# Patient Record
Sex: Female | Born: 1995 | Race: Black or African American | Hispanic: No | Marital: Single | Smoking: Never smoker
Health system: Southern US, Community
[De-identification: ages and names within clinical notes are randomized; demographics above are authoritative.]

## PROBLEM LIST (undated history)

## (undated) DIAGNOSIS — K5010 Crohn's disease of large intestine without complications: Secondary | ICD-10-CM

## (undated) DIAGNOSIS — K589 Irritable bowel syndrome without diarrhea: Secondary | ICD-10-CM

## (undated) HISTORY — DX: Irritable bowel syndrome, unspecified: K58.9

## (undated) HISTORY — DX: Crohn's disease of large intestine without complications: K50.10

## (undated) HISTORY — PX: COLONOSCOPY: SHX174

---
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
## 2015-08-09 ENCOUNTER — Other Ambulatory Visit: Payer: Self-pay | Admitting: Physician Assistant

## 2015-08-09 DIAGNOSIS — R1011 Right upper quadrant pain: Secondary | ICD-10-CM

## 2015-08-16 ENCOUNTER — Ambulatory Visit
Admission: RE | Admit: 2015-08-16 | Discharge: 2015-08-16 | Disposition: A | Discharge status: Home / Self Care | Payer: BLUE CROSS/BLUE SHIELD | Source: Ambulatory Visit | Attending: Physician Assistant | Admitting: Physician Assistant

## 2015-08-16 DIAGNOSIS — R1011 Right upper quadrant pain: Secondary | ICD-10-CM

## 2015-10-16 ENCOUNTER — Ambulatory Visit (HOSPITAL_COMMUNITY)
Admission: EM | Admit: 2015-10-16 | Discharge: 2015-10-16 | Disposition: A | Discharge status: Home / Self Care | Payer: BLUE CROSS/BLUE SHIELD | Attending: Family Medicine | Admitting: Family Medicine

## 2015-10-16 ENCOUNTER — Encounter (HOSPITAL_COMMUNITY): Payer: Self-pay | Admitting: Emergency Medicine

## 2015-10-16 DIAGNOSIS — R1084 Generalized abdominal pain: Secondary | ICD-10-CM | POA: Insufficient documentation

## 2015-10-16 DIAGNOSIS — K529 Noninfective gastroenteritis and colitis, unspecified: Secondary | ICD-10-CM | POA: Diagnosis not present

## 2015-10-16 DIAGNOSIS — R197 Diarrhea, unspecified: Secondary | ICD-10-CM | POA: Insufficient documentation

## 2015-10-16 DIAGNOSIS — R110 Nausea: Secondary | ICD-10-CM | POA: Diagnosis not present

## 2015-10-16 DIAGNOSIS — R109 Unspecified abdominal pain: Secondary | ICD-10-CM | POA: Diagnosis present

## 2015-10-16 LAB — CBC WITH DIFFERENTIAL/PLATELET
Basophils Absolute: 0 10*3/uL (ref 0.0–0.1)
Basophils Relative: 0 %
EOS PCT: 2 %
Eosinophils Absolute: 0.2 10*3/uL (ref 0.0–0.7)
HEMATOCRIT: 35.6 % — AB (ref 36.0–46.0)
Hemoglobin: 11.3 g/dL — ABNORMAL LOW (ref 12.0–15.0)
LYMPHS PCT: 19 %
Lymphs Abs: 1.8 10*3/uL (ref 0.7–4.0)
MCH: 26.6 pg (ref 26.0–34.0)
MCHC: 31.7 g/dL (ref 30.0–36.0)
MCV: 83.8 fL (ref 78.0–100.0)
Monocytes Absolute: 0.7 10*3/uL (ref 0.1–1.0)
Monocytes Relative: 7 %
NEUTROS PCT: 72 %
Neutro Abs: 6.5 10*3/uL (ref 1.7–7.7)
Platelets: 312 10*3/uL (ref 150–400)
RBC: 4.25 MIL/uL (ref 3.87–5.11)
RDW: 14.4 % (ref 11.5–15.5)
WBC: 9.1 10*3/uL (ref 4.0–10.5)

## 2015-10-16 LAB — COMPREHENSIVE METABOLIC PANEL
ALBUMIN: 3.5 g/dL (ref 3.5–5.0)
ALT: 10 U/L — ABNORMAL LOW (ref 14–54)
ANION GAP: 10 (ref 5–15)
AST: 14 U/L — ABNORMAL LOW (ref 15–41)
Alkaline Phosphatase: 67 U/L (ref 38–126)
BILIRUBIN TOTAL: 0.5 mg/dL (ref 0.3–1.2)
BUN: <5 mg/dL — ABNORMAL LOW (ref 6–20)
CALCIUM: 9.3 mg/dL (ref 8.9–10.3)
CO2: 24 mmol/L (ref 22–32)
Chloride: 103 mmol/L (ref 101–111)
Creatinine, Ser: 0.8 mg/dL (ref 0.44–1.00)
GFR calc non Af Amer: >60 mL/min (ref >60)
Glucose, Bld: 73 mg/dL (ref 65–99)
POTASSIUM: 3.7 mmol/L (ref 3.5–5.1)
Sodium: 137 mmol/L (ref 135–145)
Total Protein: 7.5 g/dL (ref 6.5–8.1)

## 2015-10-16 LAB — C-REACTIVE PROTEIN: CRP: 5.6 mg/dL — AB (ref <1.0)

## 2015-10-16 LAB — SEDIMENTATION RATE: Sed Rate: 46 mm/hr — ABNORMAL HIGH (ref 0–22)

## 2015-10-16 NOTE — ED Notes (Signed)
The patient presented to the [facility] with a complaint of abdominal pain, N/D off and on for 2 months with the last week being worse. The patient stated that she was diagnosed with IBS through [facility].

## 2015-10-16 NOTE — ED Notes (Signed)
Patient provided a sterile collection cup, gloves, and tongue blade as well as instructions on how to collect a stool sample. Instructed to bring back to [facility] when collected.

## 2015-10-16 NOTE — Discharge Instructions (Signed)
Clear liquid diet and probiotic--[employer]- until seen by specialist for recheck.

## 2015-10-16 NOTE — ED Provider Notes (Signed)
CSN: [id number]     Arrival date & time 10/16/15  1459 History   First MD Initiated Contact with Patient 10/16/15 1709     Chief Complaint  Patient presents with  . Abdominal Pain  . Diarrhea  . Nausea   (Consider location/radiation/quality/duration/timing/severity/associated sxs/prior Treatment) Patient is a 20 y.o. female presenting with abdominal pain. The history is provided by the patient and a parent.  Abdominal Pain Pain location:  Generalized Pain quality: sharp   Pain radiates to:  Does not radiate Pain severity:  Moderate Onset quality:  Gradual Duration:  8 weeks Progression:  Worsening Chronicity:  Chronic Context: not diet changes, not laxative use, not sick contacts and not suspicious food intake   Context comment:  Watery stool. Relieved by:  None tried Worsened by:  Nothing tried Ineffective treatments:  None tried Associated symptoms: diarrhea and nausea   Associated symptoms: no constipation and no vomiting   Risk factors comment:  U/s for gallbladder neg.. seen by Ok [facility] suggested IBS.   History reviewed. No pertinent past medical history. Past Surgical History  Procedure Laterality Date  . Wisdom tooth extraction  01/2013   History reviewed. No pertinent family history. Social History  Substance Use Topics  . Smoking status: Never Smoker   . Smokeless tobacco: None  . Alcohol Use: No   OB History    No data available     Review of Systems  Constitutional: Negative.   Gastrointestinal: Positive for nausea, abdominal pain, diarrhea and blood in stool. Negative for vomiting, constipation and abdominal distention.  All other systems reviewed and are negative.   Allergies  Review of patient's allergies indicates no known allergies.  Home Medications   Prior to Admission medications   Medication Sig Start Date End Date Taking? Authorizing Provider  norgestimate-ethinyl estradiol (ORTHO-CYCLEN,SPRINTEC,PREVIFEM) 0.25-35 MG-MCG tablet Take 1  tablet by mouth daily.   Yes Historical Provider, MD  HYDROcodone-acetaminophen (NORCO) 7.5-325 MG per tablet Take 1 tablet by mouth every 6 (six) hours as needed for pain.    Historical Provider, MD  ibuprofen (ADVIL,MOTRIN) 200 MG tablet Take 200 mg by mouth every 6 (six) hours as needed for pain.    Historical Provider, MD   Meds Ordered and Administered this Visit  Medications - No data to display  BP 124/90 mmHg  Pulse 86  Temp(Src) 98.9 F (37.2 C) (Oral)  Resp 18  SpO2 100%  LMP 10/09/2015 (Exact Date) No data found.   Physical Exam  Constitutional: She is oriented to person, place, and time. She appears well-developed and well-nourished. No distress.  HENT:  Mouth/Throat: Oropharynx is clear and moist.  Abdominal: Soft. Bowel sounds are normal. She exhibits no distension and no mass. There is generalized tenderness. There is no rebound and no guarding.  Neurological: She is alert and oriented to person, place, and time.  Skin: Skin is warm and dry.  Nursing note and vitals reviewed.   ED Course  Procedures (including critical care time)  Labs Review Labs Reviewed  C DIFFICILE QUICK SCREEN W PCR REFLEX  OVA + PARASITE EXAM  CBC WITH DIFFERENTIAL/PLATELET  COMPREHENSIVE METABOLIC PANEL  SEDIMENTATION RATE  C-REACTIVE PROTEIN    Imaging Review No results found.   Visual Acuity Review  Right Eye Distance:   Left Eye Distance:   Bilateral Distance:    Right Eye Near:   Left Eye Near:    Bilateral Near:         MDM   1. Diarrhea in  adult patient        Linna Hoff, MD 10/16/15 [phone]

## 2015-10-17 ENCOUNTER — Telehealth: Payer: Self-pay | Admitting: Gastroenterology

## 2015-10-17 NOTE — Telephone Encounter (Signed)
Scheduled patient with Doug Sou, PA on 10/20/15 at 2:00 PM.(armbruster is hospital MD this week). Need to call patient with appointment

## 2015-10-17 NOTE — Telephone Encounter (Signed)
The note also says she has seen [facility] for [facility].

## 2015-10-17 NOTE — Telephone Encounter (Signed)
She saw [facility] PCP

## 2015-10-20 ENCOUNTER — Ambulatory Visit (INDEPENDENT_AMBULATORY_CARE_PROVIDER_SITE_OTHER): Payer: BLUE CROSS/BLUE SHIELD | Admitting: Gastroenterology

## 2015-10-20 ENCOUNTER — Encounter: Payer: Self-pay | Admitting: Gastroenterology

## 2015-10-20 VITALS — BP 126/60 | HR 82 | Ht 66.0 in | Wt 190.0 lb

## 2015-10-20 DIAGNOSIS — R112 Nausea with vomiting, unspecified: Secondary | ICD-10-CM | POA: Diagnosis not present

## 2015-10-20 DIAGNOSIS — R1084 Generalized abdominal pain: Secondary | ICD-10-CM | POA: Insufficient documentation

## 2015-10-20 DIAGNOSIS — K625 Hemorrhage of anus and rectum: Secondary | ICD-10-CM | POA: Insufficient documentation

## 2015-10-20 DIAGNOSIS — R197 Diarrhea, unspecified: Secondary | ICD-10-CM | POA: Diagnosis not present

## 2015-10-20 DIAGNOSIS — R1110 Vomiting, unspecified: Secondary | ICD-10-CM | POA: Insufficient documentation

## 2015-10-20 MED ORDER — ONDANSETRON HCL 4 MG PO TABS: 4.0000 mg | ORAL_TABLET | Freq: Three times a day (TID) | ORAL | Status: DC | PRN

## 2015-10-20 MED ORDER — DICYCLOMINE HCL 10 MG PO CAPS: 10.0000 mg | ORAL_CAPSULE | Freq: Three times a day (TID) | ORAL | Status: DC

## 2015-10-20 MED ORDER — NA SULFATE-K SULFATE-MG SULF 17.5-3.13-1.6 GM/177ML PO SOLN: 1.0000 | Freq: Once | ORAL | Status: DC

## 2015-10-20 NOTE — Progress Notes (Signed)
Thank you for sending this case to me. I have reviewed the entire note, and the outlined plan seems appropriate.  

## 2015-10-20 NOTE — Patient Instructions (Signed)
  We have sent the following medications to your pharmacy for you to pick up at your convenience:  Zofran, Dicyclomine    You have been scheduled for a colonoscopy. Please follow written instructions given to you at your visit today.  Please pick up your prep supplies at the pharmacy within the next 1-3 days. If you use inhalers (even only as needed), please bring them with you on the day of your procedure. Your physician has requested that you go to www.startemmi.com and enter the access code given to you at your visit today. This web site gives a general overview about your procedure. However, you should still follow specific instructions given to you by our office regarding your preparation for the procedure.

## 2015-10-20 NOTE — Progress Notes (Signed)
10/20/2015 LAMOINE FREDRICKSEN [id number] 03-04-1996   HISTORY OF PRESENT ILLNESS:  This is a pleasant 20 year old female who is new to our office, was referred here by urgent care, Dr. Juventino Slovak, for evaluation of several GI symptoms.  She is here today with her mother. She complains of crampy lower abdominal pain worse after eating. She has diarrhea multiple times a day, recently 10-12 times a day, with some blood. Complains of nausea and vomiting, unable to keep much down and she really hasn't had much appetite. She tells me that some of her symptoms began about 3 months or so ago, but have significantly worsened recently.  The diarrhea and particular has particularly worsened as well as the abdominal pain.  She had a normal abdominal ultrasound back in April. Otherwise she's only had labs performed. She had a normal CBC and CMP. Sedimentation rate was elevated at 46 and CRP was elevated at 5.6, however. She denies any family history of Crohn's disease and ulcerative colitis. When she initially presented with GI complaints she was told by her PCP that she may have some irritable bowel syndrome.   Past Medical History  Diagnosis Date  . IBS (irritable bowel syndrome)    Past Surgical History  Procedure Laterality Date  . Wisdom tooth extraction  01/2013    reports that she has never smoked. She does not have any smokeless tobacco history on file. She reports that she uses illicit drugs (Marijuana). She reports that she does not drink alcohol. family history includes Leukemia in her brother; Uterine cancer in her paternal aunt. There is no history of Colon cancer. No Known Allergies    Outpatient Encounter Prescriptions as of 10/20/2015  Medication Sig  . acetaminophen (TYLENOL) 500 MG tablet Take 1,000 mg by mouth 2 (two) times daily as needed.  . norgestimate-ethinyl estradiol (ORTHO-CYCLEN,SPRINTEC,PREVIFEM) 0.25-35 MG-MCG tablet Take 1 tablet by mouth daily.  Marland Kitchen dicyclomine (BENTYL) 10 MG  capsule Take 1 capsule (10 mg total) by mouth 3 (three) times daily.  . Na Sulfate-K Sulfate-Mg Sulf 17.5-3.13-1.6 GM/180ML SOLN Take 1 kit by mouth once.  . ondansetron (ZOFRAN) 4 MG tablet Take 1 tablet (4 mg total) by mouth every 8 (eight) hours as needed for nausea or vomiting.  . [DISCONTINUED] HYDROcodone-acetaminophen (NORCO) 7.5-325 MG per tablet Take 1 tablet by mouth every 6 (six) hours as needed for pain.  . [DISCONTINUED] ibuprofen (ADVIL,MOTRIN) 200 MG tablet Take 200 mg by mouth every 6 (six) hours as needed for pain.   No facility-administered encounter medications on file as of 10/20/2015.     REVIEW OF SYSTEMS  : All other systems reviewed and negative except where noted in the History of Present Illness.   PHYSICAL EXAM: BP 126/60 mmHg  Pulse 82  Ht '5\' 6"'  (1.676 m)  Wt 190 lb (86.183 kg)  BMI 30.68 kg/m2  LMP 10/09/2015 (Exact Date) General: Well developed black female in no acute distress Head: Normocephalic and atraumatic Eyes:  Sclerae anicteric, conjunctiva pink. Ears: Normal auditory acuity Lungs: Clear throughout to auscultation Heart: Regular rate and rhythm Abdomen: Soft, non-distended.  Normal bowel sounds.  Diffuse TTP. Musculoskeletal: Symmetrical with no gross deformities  Skin: No lesions on visible extremities Extremities: No edema  Neurological: Alert oriented x 4, grossly non-focal Psychological:  Alert and cooperative. Normal mood and affect  ASSESSMENT AND PLAN: -20 year old female with multiple GI complaints including abdominal pain, diarrhea, rectal bleeding, nausea and vomiting, and decreased appetite. Some symptoms present to a degree  for a few months, but significantly worsened over the past couple of weeks. She does have elevated inflammatory markers, CRP and sedimentation rate.  We'll plan for colonoscopy for evaluation to rule out IBD.  The risks, benefits, and alternatives to colonoscopy were discussed with the patient and she consents to  proceed.  While we are awaiting colonoscopy I would like her to perform stool studies, GI pathogen panel and O&P to rule out C. difficile, etc. I'm going to treat her symptomatically with Zofran and dicyclomine for now.  Advised to remain on a clear liquid diet or soft/bland/low fat/low fiber diet as tolerated.  CC:  No ref. provider found

## 2015-10-23 ENCOUNTER — Other Ambulatory Visit: Payer: BLUE CROSS/BLUE SHIELD

## 2015-10-23 DIAGNOSIS — R197 Diarrhea, unspecified: Secondary | ICD-10-CM | POA: Diagnosis not present

## 2015-10-23 DIAGNOSIS — K625 Hemorrhage of anus and rectum: Secondary | ICD-10-CM | POA: Diagnosis not present

## 2015-10-23 DIAGNOSIS — R112 Nausea with vomiting, unspecified: Secondary | ICD-10-CM | POA: Diagnosis not present

## 2015-10-23 DIAGNOSIS — R1084 Generalized abdominal pain: Secondary | ICD-10-CM | POA: Diagnosis not present

## 2015-10-24 ENCOUNTER — Encounter: Payer: Self-pay | Admitting: Gastroenterology

## 2015-10-24 LAB — OVA AND PARASITE EXAMINATION: OP: NONE SEEN

## 2015-10-24 LAB — GASTROINTESTINAL PATHOGEN PANEL PCR
C. difficile Tox A/B, PCR: NOT DETECTED
CRYPTOSPORIDIUM, PCR: NOT DETECTED
Campylobacter, PCR: NOT DETECTED
E COLI (ETEC) LT/ST, PCR: NOT DETECTED
E COLI (STEC) STX1/STX2, PCR: NOT DETECTED
E COLI 0157, PCR: NOT DETECTED
Giardia lamblia, PCR: NOT DETECTED
NOROVIRUS, PCR: NOT DETECTED
Rotavirus A, PCR: NOT DETECTED
Salmonella, PCR: NOT DETECTED
Shigella, PCR: NOT DETECTED

## 2015-11-03 ENCOUNTER — Encounter: Payer: Self-pay | Admitting: Gastroenterology

## 2015-11-03 ENCOUNTER — Ambulatory Visit (AMBULATORY_SURGERY_CENTER): Payer: BLUE CROSS/BLUE SHIELD | Admitting: Gastroenterology

## 2015-11-03 VITALS — BP 111/72 | HR 88 | Temp 97.1°F | Resp 11 | Ht 66.0 in | Wt 190.0 lb

## 2015-11-03 DIAGNOSIS — R197 Diarrhea, unspecified: Secondary | ICD-10-CM | POA: Diagnosis not present

## 2015-11-03 DIAGNOSIS — K529 Noninfective gastroenteritis and colitis, unspecified: Secondary | ICD-10-CM | POA: Diagnosis not present

## 2015-11-03 MED ORDER — SODIUM CHLORIDE 0.9 % IV SOLN: 500.0000 mL | INTRAVENOUS | Status: DC

## 2015-11-03 MED ORDER — PREDNISONE 10 MG PO TABS: ORAL_TABLET | ORAL | Status: DC

## 2015-11-03 NOTE — Progress Notes (Signed)
Called to room to assist during endoscopic procedure.  Patient ID and intended procedure confirmed with present staff. Received instructions for my participation in the procedure from the performing physician.  

## 2015-11-03 NOTE — Op Note (Signed)
[facility] Patient Name: Ellen Patrick Procedure Date: 11/03/2015 10:18 AM MRN: [id number] Endoscopist: Sherilyn Cooter L. Myrtie Neither , MD Age: 20 Referring MD:  Date of Birth: 1996-01-25 Gender: Female Account #: [id number] Procedure:                Colonoscopy Indications:              Generalized abdominal pain, Chronic diarrhea,                            Rectal bleeding Medicines:                Monitored Anesthesia Care Procedure:                Pre-Anesthesia Assessment:                           - Prior to the procedure, a History and Physical                            was performed, and patient medications and                            allergies were reviewed. The patient's tolerance of                            previous anesthesia was also reviewed. The risks                            and benefits of the procedure and the sedation                            options and risks were discussed with the patient.                            All questions were answered, and informed consent                            was obtained. Prior Anticoagulants: The patient has                            taken no previous anticoagulant or antiplatelet                            agents. ASA Grade Assessment: II - A patient with                            mild systemic disease. After reviewing the risks                            and benefits, the patient was deemed in                            satisfactory condition to undergo the procedure.  After obtaining informed consent, the colonoscope                            was passed under direct vision. Throughout the                            procedure, the patient's blood pressure, pulse, and                            oxygen saturations were monitored continuously. The                            Model CF-HQ190L [id number]) scope was introduced                            through the anus and advanced to the 15 cm into  the                            ileum. The colonoscopy was performed without                            difficulty. The patient tolerated the procedure                            well. The quality of the bowel preparation was                            excellent. The terminal ileum, ileocecal valve,                            appendiceal orifice, and rectum were photographed.                            The bowel preparation used was Miralax. Scope In: 10:27:23 AM Scope Out: 10:43:47 AM Scope Withdrawal Time: 0 hours 12 minutes 7 seconds  Total Procedure Duration: 0 hours 16 minutes 24 seconds  Findings:                 The perianal and digital rectal examinations were                            normal.                           The terminal ileum appeared normal.                           Inflammation characterized by congestion (edema)                            and deep ulcerations was found as patches                            surrounded by normal mucosa throughout the colon.  No sites were spared, though it was mild through                            the sigmoid colon and rectum. There was a shallow                            ulcer at the anal verge, but no anal/perianal                            disease. Overall, the colitis was moderate in                            severity, and the findings are new. Biopsies were                            taken from the cecum and proximal transverse colon                            with a cold forceps for histology.                           Internal hemorrhoids were found during                            retroflexion. The hemorrhoids were Grade I                            (internal hemorrhoids that do not prolapse). Complications:            No immediate complications. Estimated Blood Loss:     Estimated blood loss: none. Impression:               - Crohn's disease with colonic involvement.                             Inflammation was found in the colon. This was                            moderate in severity. The findings are new compared                            to previous examinations. Biopsied.                           - The examined portion of the ileum was normal.                           - Internal hemorrhoids. Recommendation:           - Patient has a contact number available for                            emergencies. The signs and symptoms of potential  delayed complications were discussed with the                            patient. Return to normal activities tomorrow.                            Written discharge instructions were provided to the                            patient.                           - Continue present medications except dicyclomine.                           - Await pathology results.                           - Use prednisone 40 mg PO once a day for 14 days,                            then decrease to 30 mg once daily until office                            follow-up. Disp#100, RF: none                           - Return to my office in 3 weeks. This follow up                            will be arranged when biopsy results received next                            week.                           - Resume previous diet.                           - No recommendation at this time regarding repeat                            colonoscopy due to pending clinical course. [name] L. Myrtie Neither, MD 11/03/2015 10:56:40 AM This report has been signed electronically.

## 2015-11-03 NOTE — Progress Notes (Signed)
Report to PACU, RN, vss, BBS= Clear.  

## 2015-11-03 NOTE — Progress Notes (Signed)
New rx was sent to [facility] at [place].  Maw  No problems noted in the recovery room. maw

## 2015-11-03 NOTE — Patient Instructions (Signed)
YOU HAD AN ENDOSCOPIC PROCEDURE TODAY AT THE [facility]:   Refer to the procedure report that was given to you for any specific questions about what was found during the examination.  If the procedure report does not answer your questions, please call your gastroenterologist to clarify.  If you requested that your care partner not be given the details of your procedure findings, then the procedure report has been included in a sealed envelope for you to review at your convenience later.  YOU SHOULD EXPECT: Some feelings of bloating in the abdomen. Passage of more gas than usual.  Walking can help get rid of the air that was put into your GI tract during the procedure and reduce the bloating. If you had a lower endoscopy (such as a colonoscopy or flexible sigmoidoscopy) you may notice spotting of blood in your stool or on the toilet paper. If you underwent a bowel prep for your procedure, you may not have a normal bowel movement for a few days.  Please Note:  You might notice some irritation and congestion in your nose or some drainage.  This is from the oxygen used during your procedure.  There is no need for concern and it should clear up in a day or so.  SYMPTOMS TO REPORT IMMEDIATELY:   Following lower endoscopy (colonoscopy or flexible sigmoidoscopy):  Excessive amounts of blood in the stool  Significant tenderness or worsening of abdominal pains  Swelling of the abdomen that is new, acute  Fever of 100F or higher   For urgent or emergent issues, a gastroenterologist can be reached at any hour by calling [phone].   DIET: Your first meal following the procedure should be a small meal and then it is ok to progress to your normal diet. Heavy or fried foods are harder to digest and may make you feel nauseous or bloated.  Likewise, meals heavy in dairy and vegetables can increase bloating.  Drink plenty of fluids but you should avoid alcoholic beverages for 24  hours.  ACTIVITY:  You should plan to take it easy for the rest of today and you should NOT DRIVE or use heavy machinery until tomorrow (because of the sedation medicines used during the test).    FOLLOW UP: Our staff will call the number listed on your records the next business day following your procedure to check on you and address any questions or concerns that you may have regarding the information given to you following your procedure. If we do not reach you, we will leave a message.  However, if you are feeling well and you are not experiencing any problems, there is no need to return our call.  We will assume that you have returned to your regular daily activities without incident.  If any biopsies were taken you will be contacted by phone or by letter within the next 1-3 weeks.  Please call us at [phone] if you have not heard about the biopsies in 3 weeks.    SIGNATURES/CONFIDENTIALITY: You and/or your care partner have signed paperwork which will be entered into your electronic medical record.  These signatures attest to the fact that that the information above on your After Visit Summary has been reviewed and is understood.  Full responsibility of the confidentiality of this discharge information lies with you and/or your care-partner.   Handouts were given to your care partner on crohn's disease. Please discontinue taking diclycomine. Add prednisone to your medication list, rx sent to  your pharmacy.  Please take 4 tabs once daily for 14 days, then decrease to 3 tabs daily until you see Dr. Myrtie Neither back in the office in 3 weeks.  Office will call you to set up appointment. You may resume your other current medications today. Await biopsy results. Please call if any questions or concerns.

## 2015-11-06 ENCOUNTER — Telehealth: Payer: Self-pay | Admitting: *Deleted

## 2015-11-06 NOTE — Telephone Encounter (Signed)
  Follow up Call-  Call back number 11/03/2015  Post procedure Call Back phone  # [phone]  Permission to leave phone message Yes     Lm on number above as per pt to return call if issues or questions, marie Kalany Diekmann rn

## 2015-12-04 ENCOUNTER — Ambulatory Visit (INDEPENDENT_AMBULATORY_CARE_PROVIDER_SITE_OTHER): Payer: BLUE CROSS/BLUE SHIELD | Admitting: Gastroenterology

## 2015-12-04 ENCOUNTER — Encounter: Payer: Self-pay | Admitting: Gastroenterology

## 2015-12-04 ENCOUNTER — Encounter (INDEPENDENT_AMBULATORY_CARE_PROVIDER_SITE_OTHER): Payer: Self-pay

## 2015-12-04 VITALS — BP 120/80 | HR 89 | Ht 66.0 in | Wt 183.2 lb

## 2015-12-04 DIAGNOSIS — K625 Hemorrhage of anus and rectum: Secondary | ICD-10-CM

## 2015-12-04 DIAGNOSIS — K5010 Crohn's disease of large intestine without complications: Secondary | ICD-10-CM | POA: Diagnosis not present

## 2015-12-04 MED ORDER — MESALAMINE 1.2 G PO TBEC: 4.8000 g | DELAYED_RELEASE_TABLET | Freq: Every day | ORAL | 3 refills | Status: DC

## 2015-12-04 NOTE — Progress Notes (Signed)
[facility] Progress Note  Chief Complaint: Crohn's colitis  Subjective  History:  This is follow-up for a 20 year old woman recently diagnosed with Crohn's colitis. She started prednisone 40 mg a day for 2 weeks, then drop down to 30 mg a day for 1 week, then stopped several days ago. The initial plan was for her to follow up at the tail end of that taper and then continue to taper down further over the next 2 weeks, but she seemed to have missed understood those instructions or perhaps could not get an appointment soon enough. In either case, she is feeling quite well, with complete resolution of abdominal pain and diarrhea. She has had rare episodes of rectal bleeding in the last 3 weeks. Her appetite is good and her weight stable. She smokes marijuana regularly, which she admitted in front of her mother today, who seems to have already been aware. We discussed at length the need to discontinue marijuana use and also electronic cigarettes because of its contribution to Crohn's.  ROS: Cardiovascular:  no chest pain Respiratory: no dyspnea  The patient's Past Medical, Family and Social History were reviewed and are on file in the EMR.  Objective:  Med list reviewed  Vital signs in last 24 hrs: Vitals:   12/04/15 1529  BP: 120/80  Pulse: 89    Physical Exam She is well-appearing  HEENT: sclera anicteric, oral mucosa moist without lesions  Neck: supple, no thyromegaly, JVD or lymphadenopathy  Cardiac: RRR without murmurs, S1S2 heard, no peripheral edema  Pulm: clear to auscultation bilaterally, normal RR and effort noted  Abdomen: soft, no tenderness, with active bowel sounds. No guarding or palpable hepatosplenomegaly.  Skin; warm and dry, no jaundice or rash Her mother was present for the entire encounter Data: Biopsies consistent with inflammatory bowel disease. The endoscopic picture was most consistent with Crohn's   @ASSESSMENTPLANBEGIN @ Assessment: Encounter  Diagnoses  Name Primary?  . Crohn's colitis, without complications (HCC) Yes  . Rectal bleeding     She has had an impressive response to the first course of prednisone, despite some misunderstanding about the dosing.  Plan: I have started Lialda 4.8 g once a day. She will follow up in 6 weeks. If she has recurrence of symptoms on that, we will need to advance to immunosuppressive therapy, most likely 6-MP/azathioprine. At that point, we would need a TPMT level and appropriate vaccinations.   Total time 25 minutes, over half spent in counseling and coordination of care.  Topics discussed: Natural history of Crohn's disease, the need for long-term medication management. I also directed her to the CBC has a website for further information  support groups with other affected individuals.  Charlie Pitter III

## 2015-12-04 NOTE — Patient Instructions (Addendum)
If you are age 20 or older, your body mass index should be between 23-30. Your Body mass index is 29.57 kg/m. If this is out of the aforementioned range listed, please consider follow up with your Primary Care Provider.  If you are age 37 or younger, your body mass index should be between 19-25. Your Body mass index is 29.57 kg/m. If this is out of the aformentioned range listed, please consider follow up with your Primary Care Provider.   Please follow up in the clinic in 6 weeks. 01-16-2016 @345pm   Thank you for choosing [facility]  Dr Amada Jupiter III

## 2015-12-14 ENCOUNTER — Telehealth: Payer: Self-pay | Admitting: Gastroenterology

## 2015-12-15 NOTE — Telephone Encounter (Signed)
Left message on machine to call back  

## 2015-12-19 NOTE — Telephone Encounter (Signed)
The pt states she has only seen the blood twice and has been on the lialda 2 weeks, the bleeding has gotten better.  She will continue to monitor the bleeding and if it worsens or does not continue to improve she will call back, otherwise she will keep appt as scheduled for 01/16/16.

## 2016-01-16 ENCOUNTER — Other Ambulatory Visit (INDEPENDENT_AMBULATORY_CARE_PROVIDER_SITE_OTHER): Payer: BLUE CROSS/BLUE SHIELD

## 2016-01-16 ENCOUNTER — Ambulatory Visit (INDEPENDENT_AMBULATORY_CARE_PROVIDER_SITE_OTHER): Payer: BLUE CROSS/BLUE SHIELD | Admitting: Gastroenterology

## 2016-01-16 ENCOUNTER — Encounter: Payer: Self-pay | Admitting: Gastroenterology

## 2016-01-16 ENCOUNTER — Encounter (INDEPENDENT_AMBULATORY_CARE_PROVIDER_SITE_OTHER): Payer: Self-pay

## 2016-01-16 VITALS — BP 106/60 | HR 76 | Ht 65.25 in | Wt 185.4 lb

## 2016-01-16 DIAGNOSIS — K5010 Crohn's disease of large intestine without complications: Secondary | ICD-10-CM

## 2016-01-16 DIAGNOSIS — R197 Diarrhea, unspecified: Secondary | ICD-10-CM

## 2016-01-16 DIAGNOSIS — R5382 Chronic fatigue, unspecified: Secondary | ICD-10-CM

## 2016-01-16 LAB — CBC WITH DIFFERENTIAL/PLATELET
BASOS PCT: 0.5 % (ref 0.0–3.0)
Basophils Absolute: 0 10*3/uL (ref 0.0–0.1)
EOS ABS: 0.1 10*3/uL (ref 0.0–0.7)
Eosinophils Relative: 3.4 % (ref 0.0–5.0)
HCT: 33 % — ABNORMAL LOW (ref 36.0–46.0)
Hemoglobin: 11 g/dL — ABNORMAL LOW (ref 12.0–15.0)
LYMPHS ABS: 1.6 10*3/uL (ref 0.7–4.0)
Lymphocytes Relative: 39.5 % (ref 12.0–46.0)
MCHC: 33.3 g/dL (ref 30.0–36.0)
MCV: 82.7 fl (ref 78.0–100.0)
MONO ABS: 0.3 10*3/uL (ref 0.1–1.0)
Monocytes Relative: 7.8 % (ref 3.0–12.0)
NEUTROS ABS: 2 10*3/uL (ref 1.4–7.7)
Neutrophils Relative %: 48.8 % (ref 43.0–77.0)
PLATELETS: 277 10*3/uL (ref 150.0–400.0)
RBC: 3.99 Mil/uL (ref 3.87–5.11)
RDW: 17 % — AB (ref 11.5–14.6)
WBC: 4.2 10*3/uL — ABNORMAL LOW (ref 4.5–10.5)

## 2016-01-16 NOTE — Progress Notes (Signed)
[facility] Progress Note  Chief Complaint: Crohn's colitis  Subjective  History:  Ellen Patrick has been feeling well since her last visit on 7/31.  She has rare loose stool, no rectal bleeding, and denies abdominal pain. Her appetite is good and her weight stable. She denies eye redness, joint pain or swelling or new skin rash. Her eczema has lately been bothering her on the hands, which she attributes to change in the weather. She is also up-to-date on primary gynecologic care, stating that she saw her gynecologist within the last year for a Pap smear, and that she previously had the HPV vaccine  ROS: Cardiovascular:  no chest pain Respiratory: no dyspnea Positive for fatigue  The patient's Past Medical, Family and Social History were reviewed and are on file in the EMR. 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 Objective:  Med list reviewed  Vital signs in last 24 hrs: Vitals:   01/16/16 0938  BP: 106/60  Pulse: 76    Physical Exam   HEENT: sclera anicteric, oral mucosa moist without lesions  Neck: supple, no thyromegaly, JVD or lymphadenopathy  Cardiac: RRR without murmurs, S1S2 heard, no peripheral edema  Pulm: clear to auscultation bilaterally, normal RR and effort noted  Abdomen: soft, No tenderness, with active bowel sounds. No guarding or palpable hepatosplenomegaly.  Skin; warm and dry, no jaundice or rash   @ASSESSMENTPLANBEGIN @ Assessment: Encounter Diagnoses  Name Primary?  . Crohn's colitis, without complications (HCC) Yes  . Diarrhea, unspecified type   . Chronic fatigue    Her Crohn's colitis came under good control after initial prednisone taper and institution of mesalamine therapy. She is continued to feel well over the last 6 weeks on that regimen.  Plan:  CBC today Continue Lialda 4.8 g once daily Follow-up in 3 months or sooner as needed She also reports that she will get a flu shot this  fall.   Total time 20 minutes, over half spent in counseling and coordination of care.   Charlie Pitter III

## 2016-01-16 NOTE — Patient Instructions (Addendum)
If you are age 20 or older, your body mass index should be between 23-30. Your Body mass index is 30.61 kg/m. If this is out of the aforementioned range listed, please consider follow up with your Primary Care Provider.  If you are age 20 or younger, your body mass index should be between 19-25. Your Body mass index is 30.61 kg/m. If this is out of the aformentioned range listed, please consider follow up with your Primary Care Provider.   Your physician has requested that you go to the basement for the following lab work before leaving today: CBC  Please follow up in three months.  Thank you for choosing [facility]  Dr Amada JupiterHenry Danis III

## 2016-01-17 ENCOUNTER — Other Ambulatory Visit: Payer: Self-pay

## 2016-02-19 DIAGNOSIS — Z01419 Encounter for gynecological examination (general) (routine) without abnormal findings: Secondary | ICD-10-CM | POA: Diagnosis not present

## 2016-02-19 DIAGNOSIS — Z6829 Body mass index (BMI) 29.0-29.9, adult: Secondary | ICD-10-CM | POA: Diagnosis not present

## 2016-02-19 DIAGNOSIS — Z23 Encounter for immunization: Secondary | ICD-10-CM | POA: Diagnosis not present

## 2016-04-09 ENCOUNTER — Other Ambulatory Visit: Payer: Self-pay | Admitting: Gastroenterology

## 2016-04-09 NOTE — Telephone Encounter (Signed)
Rx refill request for Lialda 1.2 grams 4 po daily. Follow up on 04-12-2016

## 2016-04-12 ENCOUNTER — Encounter: Payer: Self-pay | Admitting: Gastroenterology

## 2016-04-12 ENCOUNTER — Ambulatory Visit (INDEPENDENT_AMBULATORY_CARE_PROVIDER_SITE_OTHER): Payer: BLUE CROSS/BLUE SHIELD | Admitting: Gastroenterology

## 2016-04-12 ENCOUNTER — Other Ambulatory Visit (INDEPENDENT_AMBULATORY_CARE_PROVIDER_SITE_OTHER): Payer: BLUE CROSS/BLUE SHIELD

## 2016-04-12 VITALS — BP 96/64 | HR 72 | Ht 65.25 in | Wt 180.2 lb

## 2016-04-12 DIAGNOSIS — R197 Diarrhea, unspecified: Secondary | ICD-10-CM

## 2016-04-12 DIAGNOSIS — R5382 Chronic fatigue, unspecified: Secondary | ICD-10-CM

## 2016-04-12 DIAGNOSIS — K5010 Crohn's disease of large intestine without complications: Secondary | ICD-10-CM | POA: Diagnosis not present

## 2016-04-12 LAB — IBC PANEL
Iron: 43 ug/dL (ref 42–145)
SATURATION RATIOS: 9.3 % — AB (ref 20.0–50.0)
TRANSFERRIN: 332 mg/dL (ref 212.0–360.0)

## 2016-04-12 LAB — CBC WITH DIFFERENTIAL/PLATELET
BASOS ABS: 0 10*3/uL (ref 0.0–0.1)
Basophils Relative: 0.5 % (ref 0.0–3.0)
EOS PCT: 2.6 % (ref 0.0–5.0)
Eosinophils Absolute: 0.1 10*3/uL (ref 0.0–0.7)
HCT: 37.2 % (ref 36.0–46.0)
HEMOGLOBIN: 12.5 g/dL (ref 12.0–15.0)
Lymphocytes Relative: 42 % (ref 12.0–46.0)
Lymphs Abs: 1.8 10*3/uL (ref 0.7–4.0)
MCHC: 33.5 g/dL (ref 30.0–36.0)
MCV: 86.3 fl (ref 78.0–100.0)
MONOS PCT: 9.4 % (ref 3.0–12.0)
Monocytes Absolute: 0.4 10*3/uL (ref 0.1–1.0)
Neutro Abs: 2 10*3/uL (ref 1.4–7.7)
Neutrophils Relative %: 45.5 % (ref 43.0–77.0)
Platelets: 238 10*3/uL (ref 150.0–400.0)
RBC: 4.31 Mil/uL (ref 3.87–5.11)
RDW: 15.4 % — ABNORMAL HIGH (ref 11.5–14.6)
WBC: 4.4 10*3/uL — AB (ref 4.5–10.5)

## 2016-04-12 LAB — FERRITIN: Ferritin: 20.2 ng/mL (ref 10.0–291.0)

## 2016-04-12 LAB — TSH: TSH: 0.9 u[IU]/mL (ref 0.35–5.50)

## 2016-04-12 LAB — HIGH SENSITIVITY CRP: CRP, High Sensitivity: 5.25 mg/L — ABNORMAL HIGH (ref 0.000–5.000)

## 2016-04-12 NOTE — Patient Instructions (Signed)
If you are age 20 or older, your body mass index should be between 23-30. Your Body mass index is 29.77 kg/m. If this is out of the aforementioned range listed, please consider follow up with your Primary Care Provider.  If you are age 66 or younger, your body mass index should be between 19-25. Your Body mass index is 29.77 kg/m. If this is out of the aformentioned range listed, please consider follow up with your Primary Care Provider.   Your physician has requested that you go to the basement for lab work before leaving today.  Thank you for choosing [facility]  Dr Amada Jupiter III

## 2016-04-12 NOTE — Progress Notes (Addendum)
[facility] Progress Note  Chief Complaint: Crohn's colitis  Subjective  History:  Ellen Patrick sees me for the first time in about 3 months. She stills feels well from a digestive standpoint, with a loose nonbloody stool maybe twice a week. She denies abdominal cramps or rectal bleeding. Overall, her symptoms are greatly improved from what they were prior to diagnosis. She was complaining of fatigue at the last visit, and hemoglobin was 11.0, down only slightly from 11.3 in June. She's been taking iron tablets since then, and says she has her usual menstrual cycle of 3 days of bleeding. She is just feeling more fatigued than before and wonders why. Ellen Patrick has continued to take four Lialda tablets once daily. ROS: Cardiovascular:  no chest pain Respiratory: no dyspnea  The patient's Past Medical, Family and Social History were reviewed and are on file in the EMR.  Objective:  Med list reviewed  Vital signs in last 24 hrs: Vitals:   04/12/16 1043  BP: 96/64  Pulse: 72    Physical Exam    HEENT: sclera anicteric, oral mucosa moist without lesions  Neck: supple, no thyromegaly, JVD or lymphadenopathy  Cardiac: RRR without murmurs, S1S2 heard, no peripheral edema  Pulm: clear to auscultation bilaterally, normal RR and effort noted  Abdomen: soft, No tenderness, with active bowel sounds. No guarding or palpable hepatosplenomegaly.  Skin; warm and dry, no jaundice or rash  Recent Labs:  crp was 5.6 before Dx in June CBC Latest Ref Rng & Units 01/16/2016 10/16/2015  WBC 4.5 - 10.5 K/uL 4.2(L) 9.1  Hemoglobin 12.0 - 15.0 g/dL 11.0(L) 11.3(L)  Hematocrit 36.0 - 46.0 % 33.0(L) 35.6(L)  Platelets 150.0 - 400.0 K/uL 277.0 312   @ASSESSMENTPLANBEGIN @ Assessment: Encounter Diagnoses  Name Primary?  . Crohn's colitis, without complications (HCC) Yes  . Diarrhea, unspecified type   . Chronic fatigue    Although her digestive symptoms are reportedly much improved, I still have to  wonder if her Crohn's is under the kind of control we would like.   Plan: CRP, fecal WBCs, CBC, TSH  If it is not entirely clear, she may need repeat colonoscopy  Total time 25 minutes, over half spent in counseling and coordination of care.   Charlie PitterHenry L Danis III   Labs after office visit today: HS CRP - just over nml at 5.2 TSH nml Hgb normal at 12.5 Ferritin 20  Awaiting stool wbc

## 2016-04-15 ENCOUNTER — Other Ambulatory Visit: Payer: BLUE CROSS/BLUE SHIELD

## 2016-04-15 DIAGNOSIS — R5382 Chronic fatigue, unspecified: Secondary | ICD-10-CM

## 2016-04-15 DIAGNOSIS — K5010 Crohn's disease of large intestine without complications: Secondary | ICD-10-CM

## 2016-04-15 DIAGNOSIS — R197 Diarrhea, unspecified: Secondary | ICD-10-CM | POA: Diagnosis not present

## 2016-04-16 LAB — FECAL LACTOFERRIN, QUANT: LACTOFERRIN: POSITIVE

## 2016-04-17 ENCOUNTER — Telehealth: Payer: Self-pay | Admitting: Gastroenterology

## 2016-05-01 DIAGNOSIS — L308 Other specified dermatitis: Secondary | ICD-10-CM | POA: Diagnosis not present

## 2016-05-16 NOTE — Telephone Encounter (Signed)
Pt was advised to lab results on 04/17/16. Additional notes under labs

## 2016-06-25 DIAGNOSIS — K5010 Crohn's disease of large intestine without complications: Secondary | ICD-10-CM | POA: Diagnosis not present

## 2016-06-26 DIAGNOSIS — L539 Erythematous condition, unspecified: Secondary | ICD-10-CM | POA: Diagnosis not present

## 2016-06-26 DIAGNOSIS — N766 Ulceration of vulva: Secondary | ICD-10-CM | POA: Diagnosis not present

## 2016-06-26 DIAGNOSIS — N760 Acute vaginitis: Secondary | ICD-10-CM | POA: Diagnosis not present

## 2016-08-06 DIAGNOSIS — J101 Influenza due to other identified influenza virus with other respiratory manifestations: Secondary | ICD-10-CM | POA: Diagnosis not present

## 2016-08-06 DIAGNOSIS — R509 Fever, unspecified: Secondary | ICD-10-CM | POA: Diagnosis not present

## 2016-10-17 DIAGNOSIS — L239 Allergic contact dermatitis, unspecified cause: Secondary | ICD-10-CM | POA: Diagnosis not present

## 2016-10-21 DIAGNOSIS — L258 Unspecified contact dermatitis due to other agents: Secondary | ICD-10-CM | POA: Diagnosis not present

## 2017-01-08 ENCOUNTER — Telehealth: Payer: Self-pay

## 2017-01-08 NOTE — Telephone Encounter (Signed)
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 Form has been completed. Faxed to [employer] number listed on the form. Called pt to inform. She was asked to schedule a follow as she has not been in since December 2017. She states she cant afford a visit at this time. Her insurance is not very good and it costs to much for her to come in. Advised to have a visit by the end of the year

## 2017-01-08 NOTE — Telephone Encounter (Signed)
Thanks for letting me know. She has a chronic condition requiring management, so I need to see her by the end of this year.

## 2017-02-04 DIAGNOSIS — L308 Other specified dermatitis: Secondary | ICD-10-CM | POA: Diagnosis not present

## 2017-02-19 DIAGNOSIS — Z01419 Encounter for gynecological examination (general) (routine) without abnormal findings: Secondary | ICD-10-CM | POA: Diagnosis not present

## 2017-02-19 DIAGNOSIS — Z6829 Body mass index (BMI) 29.0-29.9, adult: Secondary | ICD-10-CM | POA: Diagnosis not present

## 2017-05-16 DIAGNOSIS — L308 Other specified dermatitis: Secondary | ICD-10-CM | POA: Diagnosis not present

## 2017-06-19 DIAGNOSIS — K5010 Crohn's disease of large intestine without complications: Secondary | ICD-10-CM | POA: Diagnosis not present

## 2017-08-28 DIAGNOSIS — J209 Acute bronchitis, unspecified: Secondary | ICD-10-CM | POA: Diagnosis not present

## 2017-09-03 DIAGNOSIS — L52 Erythema nodosum: Secondary | ICD-10-CM | POA: Diagnosis not present

## 2017-09-19 DIAGNOSIS — L52 Erythema nodosum: Secondary | ICD-10-CM | POA: Diagnosis not present

## 2017-12-16 DIAGNOSIS — N760 Acute vaginitis: Secondary | ICD-10-CM | POA: Diagnosis not present

## 2018-03-25 DIAGNOSIS — Z01419 Encounter for gynecological examination (general) (routine) without abnormal findings: Secondary | ICD-10-CM | POA: Diagnosis not present

## 2018-03-25 DIAGNOSIS — Z6826 Body mass index (BMI) 26.0-26.9, adult: Secondary | ICD-10-CM | POA: Diagnosis not present

## 2018-05-26 DIAGNOSIS — K5010 Crohn's disease of large intestine without complications: Secondary | ICD-10-CM | POA: Diagnosis not present

## 2018-05-26 DIAGNOSIS — Z09 Encounter for follow-up examination after completed treatment for conditions other than malignant neoplasm: Secondary | ICD-10-CM | POA: Diagnosis not present

## 2019-01-30 DIAGNOSIS — R5383 Other fatigue: Secondary | ICD-10-CM | POA: Diagnosis not present

## 2019-01-30 DIAGNOSIS — R52 Pain, unspecified: Secondary | ICD-10-CM | POA: Diagnosis not present

## 2019-01-31 DIAGNOSIS — R52 Pain, unspecified: Secondary | ICD-10-CM | POA: Diagnosis not present

## 2019-02-07 DIAGNOSIS — B373 Candidiasis of vulva and vagina: Secondary | ICD-10-CM | POA: Diagnosis not present

## 2019-02-07 DIAGNOSIS — N898 Other specified noninflammatory disorders of vagina: Secondary | ICD-10-CM | POA: Diagnosis not present

## 2019-03-15 DIAGNOSIS — Z0000 Encounter for general adult medical examination without abnormal findings: Secondary | ICD-10-CM | POA: Diagnosis not present

## 2019-03-15 DIAGNOSIS — Z131 Encounter for screening for diabetes mellitus: Secondary | ICD-10-CM | POA: Diagnosis not present

## 2019-03-15 DIAGNOSIS — Z8249 Family history of ischemic heart disease and other diseases of the circulatory system: Secondary | ICD-10-CM | POA: Diagnosis not present

## 2019-03-29 DIAGNOSIS — Z113 Encounter for screening for infections with a predominantly sexual mode of transmission: Secondary | ICD-10-CM | POA: Diagnosis not present

## 2019-03-29 DIAGNOSIS — N760 Acute vaginitis: Secondary | ICD-10-CM | POA: Diagnosis not present

## 2019-03-29 DIAGNOSIS — Z6825 Body mass index (BMI) 25.0-25.9, adult: Secondary | ICD-10-CM | POA: Diagnosis not present

## 2019-03-29 DIAGNOSIS — Z01419 Encounter for gynecological examination (general) (routine) without abnormal findings: Secondary | ICD-10-CM | POA: Diagnosis not present

## 2019-04-11 DIAGNOSIS — Z20828 Contact with and (suspected) exposure to other viral communicable diseases: Secondary | ICD-10-CM | POA: Diagnosis not present

## 2019-04-12 ENCOUNTER — Other Ambulatory Visit: Payer: Self-pay

## 2019-04-12 DIAGNOSIS — Z20822 Contact with and (suspected) exposure to covid-19: Secondary | ICD-10-CM

## 2019-04-14 LAB — NOVEL CORONAVIRUS, NAA: SARS-CoV-2, NAA: NOT DETECTED

## 2019-06-21 DIAGNOSIS — D3101 Benign neoplasm of right conjunctiva: Secondary | ICD-10-CM | POA: Diagnosis not present

## 2019-07-26 ENCOUNTER — Ambulatory Visit: Payer: BC Managed Care – PPO | Attending: Internal Medicine

## 2019-07-26 DIAGNOSIS — Z20822 Contact with and (suspected) exposure to covid-19: Secondary | ICD-10-CM

## 2019-07-27 LAB — SARS-COV-2, NAA 2 DAY TAT

## 2019-07-27 LAB — NOVEL CORONAVIRUS, NAA

## 2019-07-28 ENCOUNTER — Ambulatory Visit: Payer: BC Managed Care – PPO | Attending: Internal Medicine

## 2019-07-28 DIAGNOSIS — Z20822 Contact with and (suspected) exposure to covid-19: Secondary | ICD-10-CM | POA: Diagnosis not present

## 2019-07-29 LAB — NOVEL CORONAVIRUS, NAA: SARS-CoV-2, NAA: DETECTED — AB

## 2019-07-29 LAB — SARS-COV-2, NAA 2 DAY TAT

## 2019-07-30 ENCOUNTER — Telehealth: Payer: Self-pay | Admitting: Nurse Practitioner

## 2019-07-30 NOTE — Telephone Encounter (Signed)
Called to Discuss with patient about Covid symptoms and the use of bamlanivimab, a monoclonal antibody infusion for those with mild to moderate Covid symptoms and at a high risk of hospitalization.     Pt is qualified for this infusion at the [facility] infusion center due to co-morbid conditions and/or a member of an at-risk group.     At risk due to Chrohn's  Patient declines infusion at this time. Symptoms tier reviewed as well as criteria for ending isolation. Preventative practices reviewed. Patient verbalized understanding.    Patient advised to call back if she decides that she does want to get infusion. Callback number to the infusion center given. Patient advised to go to Urgent care or ED with severe symptoms.    Symptoms started 06/29/19.

## 2019-08-30 DIAGNOSIS — R309 Painful micturition, unspecified: Secondary | ICD-10-CM | POA: Diagnosis not present

## 2019-08-30 DIAGNOSIS — N760 Acute vaginitis: Secondary | ICD-10-CM | POA: Diagnosis not present

## 2019-09-08 DIAGNOSIS — N760 Acute vaginitis: Secondary | ICD-10-CM | POA: Diagnosis not present

## 2019-09-08 DIAGNOSIS — A5900 Urogenital trichomoniasis, unspecified: Secondary | ICD-10-CM | POA: Diagnosis not present

## 2019-09-08 DIAGNOSIS — Z113 Encounter for screening for infections with a predominantly sexual mode of transmission: Secondary | ICD-10-CM | POA: Diagnosis not present

## 2019-09-22 DIAGNOSIS — R600 Localized edema: Secondary | ICD-10-CM | POA: Diagnosis not present

## 2019-10-01 DIAGNOSIS — N898 Other specified noninflammatory disorders of vagina: Secondary | ICD-10-CM | POA: Diagnosis not present

## 2019-10-01 DIAGNOSIS — R350 Frequency of micturition: Secondary | ICD-10-CM | POA: Diagnosis not present

## 2019-11-19 DIAGNOSIS — R300 Dysuria: Secondary | ICD-10-CM | POA: Diagnosis not present

## 2019-11-19 DIAGNOSIS — B373 Candidiasis of vulva and vagina: Secondary | ICD-10-CM | POA: Diagnosis not present

## 2019-11-25 DIAGNOSIS — N9089 Other specified noninflammatory disorders of vulva and perineum: Secondary | ICD-10-CM | POA: Diagnosis not present

## 2019-11-25 DIAGNOSIS — A599 Trichomoniasis, unspecified: Secondary | ICD-10-CM | POA: Diagnosis not present

## 2019-11-25 DIAGNOSIS — N760 Acute vaginitis: Secondary | ICD-10-CM | POA: Diagnosis not present

## 2019-11-25 DIAGNOSIS — B373 Candidiasis of vulva and vagina: Secondary | ICD-10-CM | POA: Diagnosis not present

## 2019-11-25 DIAGNOSIS — Z113 Encounter for screening for infections with a predominantly sexual mode of transmission: Secondary | ICD-10-CM | POA: Diagnosis not present

## 2019-12-02 DIAGNOSIS — L03011 Cellulitis of right finger: Secondary | ICD-10-CM | POA: Diagnosis not present

## 2020-01-04 DIAGNOSIS — L2089 Other atopic dermatitis: Secondary | ICD-10-CM | POA: Diagnosis not present

## 2020-01-05 DIAGNOSIS — B009 Herpesviral infection, unspecified: Secondary | ICD-10-CM | POA: Insufficient documentation

## 2020-01-05 DIAGNOSIS — Z113 Encounter for screening for infections with a predominantly sexual mode of transmission: Secondary | ICD-10-CM | POA: Diagnosis not present

## 2020-01-05 DIAGNOSIS — N760 Acute vaginitis: Secondary | ICD-10-CM | POA: Diagnosis not present

## 2020-04-26 DIAGNOSIS — Z01419 Encounter for gynecological examination (general) (routine) without abnormal findings: Secondary | ICD-10-CM | POA: Diagnosis not present

## 2020-04-26 DIAGNOSIS — Z6832 Body mass index (BMI) 32.0-32.9, adult: Secondary | ICD-10-CM | POA: Diagnosis not present

## 2020-04-26 DIAGNOSIS — Z113 Encounter for screening for infections with a predominantly sexual mode of transmission: Secondary | ICD-10-CM | POA: Diagnosis not present

## 2020-12-06 ENCOUNTER — Other Ambulatory Visit: Payer: Self-pay

## 2020-12-06 ENCOUNTER — Ambulatory Visit (INDEPENDENT_AMBULATORY_CARE_PROVIDER_SITE_OTHER): Payer: Commercial Managed Care - PPO

## 2020-12-06 ENCOUNTER — Ambulatory Visit (INDEPENDENT_AMBULATORY_CARE_PROVIDER_SITE_OTHER): Payer: Commercial Managed Care - PPO | Admitting: Podiatry

## 2020-12-06 DIAGNOSIS — L539 Erythematous condition, unspecified: Secondary | ICD-10-CM | POA: Diagnosis not present

## 2020-12-06 DIAGNOSIS — L02612 Cutaneous abscess of left foot: Secondary | ICD-10-CM

## 2020-12-06 DIAGNOSIS — M79672 Pain in left foot: Secondary | ICD-10-CM

## 2020-12-06 MED ORDER — DOXYCYCLINE HYCLATE 100 MG PO TABS: 100.0000 mg | ORAL_TABLET | Freq: Two times a day (BID) | ORAL | 0 refills | Status: DC

## 2020-12-06 MED ORDER — TERBINAFINE HCL 250 MG PO TABS: 250.0000 mg | ORAL_TABLET | Freq: Every day | ORAL | 0 refills | Status: DC

## 2020-12-06 MED ORDER — CIPROFLOXACIN HCL 500 MG PO TABS: 500.0000 mg | ORAL_TABLET | Freq: Two times a day (BID) | ORAL | 0 refills | Status: DC

## 2020-12-08 NOTE — Progress Notes (Signed)
Subjective:  Patient ID: Ellen Patrick, female    DOB: 09/11/1995,  MRN: [id number]  Chief Complaint  Patient presents with   Foot Pain    Left foot pain and swelling     25 y.o. female presents with the above complaint.  Patient presents with complaint left superficial abscess formation that happened in the dorsal part of the foot.  Patient states it started to swell.  Patient states that happened in June when she was in the [place].  She states she went in the waters.  She does not recall getting bit or stepping on anything.  She is not a diabetic.  She would like to discuss treatment options for this.  She said that she was put on antibiotics by her primary care doctor but has not helped.  It has progressed to gotten worse.  She denies any other acute complaints   Review of Systems: Negative except as noted in the HPI. Denies N/V/F/Ch.  Past Medical History:  Diagnosis Date   Crohn's colitis (HCC)    IBS (irritable bowel syndrome)     Current Outpatient Medications:    ciprofloxacin (CIPRO) 500 MG tablet, Take 1 tablet (500 mg total) by mouth 2 (two) times daily for 14 days., Disp: 28 tablet, Rfl: 0   doxycycline (VIBRA-TABS) 100 MG tablet, Take 1 tablet (100 mg total) by mouth 2 (two) times daily., Disp: 28 tablet, Rfl: 0   terbinafine (LAMISIL) 250 MG tablet, Take 1 tablet (250 mg total) by mouth daily., Disp: 14 tablet, Rfl: 0   IRON PO, Take 65 mg by mouth daily., Disp: , Rfl:    mesalamine (LIALDA) 1.2 g EC tablet, take 4 tablets by mouth daily WITH BREAKFAST, Disp: 368 tablet, Rfl: 3   Multiple Vitamin (MULTIVITAMIN) tablet, Take 1 tablet by mouth daily., Disp: , Rfl:    norgestimate-ethinyl estradiol (ORTHO-CYCLEN,SPRINTEC,PREVIFEM) 0.25-35 MG-MCG tablet, Take 1 tablet by mouth daily., Disp: , Rfl:   Social History   Tobacco Use  Smoking Status Never  Smokeless Tobacco Never    No Known Allergies Objective:  There were no vitals filed for this visit. There is no  height or weight on file to calculate BMI. Constitutional Well developed. Well nourished.  Vascular Dorsalis pedis pulses palpable bilaterally. Posterior tibial pulses palpable bilaterally. Capillary refill normal to all digits.  No cyanosis or clubbing noted. Pedal hair growth normal.  Neurologic Normal speech. Oriented to person, place, and time. Epicritic sensation to light touch grossly present bilaterally.  Dermatologic Nails well groomed and normal in appearance. No open wounds. No skin lesions.  Orthopedic: Superficial abscess formation noted to the left dorsal foot as well as multiple distant sites of abscess noted on the leg.  No purulent drainage was expressed.  There are open wounds draining.  Does not probe down to deep tissue.  Mild redness noticed around the sites.  No cellulitis noted.   Radiographs: 3 views of skeletally mature left foot: No abnormalities noted.  No bony infections noted.  No soft tissue emphysema noted.  Mild soft tissue edema noted.  No gas noted.  No osteomyelitis noted Assessment:   1. Foot abscess, left   2. Erythema    Plan:  Patient was evaluated and treated and all questions answered.  Left dorsal foot superficial abscess multiple sites nonpurulent draining -I explained the patient the etiology of abscess and various treatment options were discussed.  This could likely have happened from the [facility] in [place] she may have gotten bitten  or stepped on something that could have led to this.  I discussed this with the patient in extensive detail.  At this time I will put her on broad-spectrum antibiotics of doxycycline and Cipro in addition I would also monitor and would like to add antifungal to this as well.  Patient agrees with the plan would like to proceed with multiple medication to help control the infection.  If there is no improvement or continues to get worse have asked her to go to the emergency room for IV antibiotics.  She states  understanding. -Doxy cyclin and ciprofloxacin Lamisil was sent to the pharmacy  No follow-ups on file.

## 2020-12-12 ENCOUNTER — Telehealth: Payer: Self-pay | Admitting: *Deleted

## 2020-12-12 NOTE — Telephone Encounter (Signed)
Patient is calling because since taking the antibiotics prescribed,is having headaches,body aches and have noticed more blisters on the foot. Can something else be prescribed? Please advise.

## 2020-12-12 NOTE — Telephone Encounter (Signed)
Returned the call to patient and gave recommendations per physician,verbalized understanding and said that she will discontinue the antibiotics until after ER visit.

## 2020-12-20 ENCOUNTER — Ambulatory Visit (HOSPITAL_COMMUNITY)
Admission: RE | Admit: 2020-12-20 | Discharge: 2020-12-20 | Disposition: A | Discharge status: Home / Self Care | Payer: Commercial Managed Care - PPO | Source: Ambulatory Visit

## 2020-12-20 ENCOUNTER — Ambulatory Visit (HOSPITAL_COMMUNITY): Payer: Commercial Managed Care - PPO

## 2020-12-20 ENCOUNTER — Encounter (HOSPITAL_COMMUNITY): Payer: Self-pay

## 2020-12-20 ENCOUNTER — Other Ambulatory Visit: Payer: Self-pay

## 2020-12-20 ENCOUNTER — Emergency Department (HOSPITAL_COMMUNITY)
Admission: EM | Admit: 2020-12-20 | Discharge: 2020-12-21 | Disposition: A | Discharge status: Home / Self Care | Payer: Commercial Managed Care - PPO | Attending: Emergency Medicine | Admitting: Emergency Medicine

## 2020-12-20 ENCOUNTER — Emergency Department (HOSPITAL_COMMUNITY): Payer: Commercial Managed Care - PPO

## 2020-12-20 VITALS — BP 124/69 | HR 73 | Temp 99.1°F | Resp 18

## 2020-12-20 DIAGNOSIS — L88 Pyoderma gangrenosum: Secondary | ICD-10-CM | POA: Diagnosis not present

## 2020-12-20 DIAGNOSIS — L98499 Non-pressure chronic ulcer of skin of other sites with unspecified severity: Secondary | ICD-10-CM | POA: Diagnosis not present

## 2020-12-20 DIAGNOSIS — R2243 Localized swelling, mass and lump, lower limb, bilateral: Secondary | ICD-10-CM | POA: Diagnosis present

## 2020-12-20 LAB — CBC WITH DIFFERENTIAL/PLATELET
Abs Immature Granulocytes: 0.01 10*3/uL (ref 0.00–0.07)
Basophils Absolute: 0 10*3/uL (ref 0.0–0.1)
Basophils Relative: 1 %
Eosinophils Absolute: 0.1 10*3/uL (ref 0.0–0.5)
Eosinophils Relative: 2 %
HCT: 36 % (ref 36.0–46.0)
Hemoglobin: 11.4 g/dL — ABNORMAL LOW (ref 12.0–15.0)
Immature Granulocytes: 0 %
Lymphocytes Relative: 38 %
Lymphs Abs: 2.2 10*3/uL (ref 0.7–4.0)
MCH: 27.4 pg (ref 26.0–34.0)
MCHC: 31.7 g/dL (ref 30.0–36.0)
MCV: 86.5 fL (ref 80.0–100.0)
Monocytes Absolute: 0.4 10*3/uL (ref 0.1–1.0)
Monocytes Relative: 7 %
Neutro Abs: 3.1 10*3/uL (ref 1.7–7.7)
Neutrophils Relative %: 52 %
Platelets: 350 10*3/uL (ref 150–400)
RBC: 4.16 MIL/uL (ref 3.87–5.11)
RDW: 14.4 % (ref 11.5–15.5)
WBC: 5.8 10*3/uL (ref 4.0–10.5)
nRBC: 0 % (ref 0.0–0.2)

## 2020-12-20 LAB — BASIC METABOLIC PANEL
Anion gap: 8 (ref 5–15)
BUN: 13 mg/dL (ref 6–20)
CO2: 28 mmol/L (ref 22–32)
Calcium: 9.4 mg/dL (ref 8.9–10.3)
Chloride: 102 mmol/L (ref 98–111)
Creatinine, Ser: 0.69 mg/dL (ref 0.44–1.00)
GFR, Estimated: >60 mL/min (ref >60)
Glucose, Bld: 93 mg/dL (ref 70–99)
Potassium: 3.8 mmol/L (ref 3.5–5.1)
Sodium: 138 mmol/L (ref 135–145)

## 2020-12-20 LAB — I-STAT BETA HCG BLOOD, ED (MC, WL, AP ONLY): I-stat hCG, quantitative: <5 m[IU]/mL (ref <5)

## 2020-12-20 LAB — LACTIC ACID, PLASMA: Lactic Acid, Venous: 0.5 mmol/L (ref 0.5–1.9)

## 2020-12-20 NOTE — ED Provider Notes (Signed)
[facility]    CSN: [id number] Arrival date & time: 12/20/20  1640      History   Chief Complaint Chief Complaint  Patient presents with   APPOINMENT: Wound Check    HPI Ellen Patrick is a 25 y.o. female presenting with wound on the back of her leg for about 2 weeks.  Denies trauma.  Medical history Crohn's disease. Has been followed by podiatry for cellulitis of the foot, has been treated with doxycycline x2 and ciprofloxacin x1 with improvement in foot, but now with L medial ankle ulceration x2 weeks, tender. Denies fevers/chills. She is not a diabetic.   Podiatry note from 12/06/20: "presenting with complaint left superficial abscess formation that happened in the dorsal part of the foot.  Patient states it started to swell.  Patient states that happened in June when she was in the [place].  She states she went in the waters.  She does not recall getting bit or stepping on anything.  She is not a diabetic."  HPI  Past Medical History:  Diagnosis Date   Crohn's colitis (HCC)    IBS (irritable bowel syndrome)     There are no problems to display for this patient.   Past Surgical History:  Procedure Laterality Date   WISDOM TOOTH EXTRACTION  01/2013    OB History   No obstetric history on file.      Home Medications    Prior to Admission medications   Medication Sig Start Date End Date Taking? Authorizing Provider  IRON PO Take 65 mg by mouth daily.    [provider]  mesalamine (LIALDA) 1.2 g EC tablet take 4 tablets by mouth daily WITH BREAKFAST 04/09/16   Sherrilyn Rist, MD  Multiple Vitamin (MULTIVITAMIN) tablet Take 1 tablet by mouth daily.    [provider]  norgestimate-ethinyl estradiol (ORTHO-CYCLEN,SPRINTEC,PREVIFEM) 0.25-35 MG-MCG tablet Take 1 tablet by mouth daily.    [provider]  terbinafine (LAMISIL) 250 MG tablet Take 1 tablet (250 mg total) by mouth daily. 12/06/20   Candelaria Stagers, DPM    Family  History Family History  Problem Relation Age of Onset   Leukemia Brother    Uterine cancer Paternal Aunt    Colon cancer Neg Hx     Social History Social History   Tobacco Use   Smoking status: Never   Smokeless tobacco: Never  Substance Use Topics   Alcohol use: No    Alcohol/week: 0.0 standard drinks   Drug use: Yes    Types: Marijuana     Allergies   Patient has no known allergies.   Review of Systems Review of Systems  Skin:  Positive for wound.  All other systems reviewed and are negative.   Physical Exam Triage Vital Signs ED Triage Vitals  Enc Vitals Group     BP 12/20/20 1723 124/69     Pulse Rate 12/20/20 1723 73     Resp 12/20/20 1723 18     Temp 12/20/20 1723 99.1 F (37.3 C)     Temp Source 12/20/20 1723 Oral     SpO2 12/20/20 1723 100 %     Weight --      Height --      Head Circumference --      Peak Flow --      Pain Score 12/20/20 1725 5     Pain Loc --      Pain Edu? --  Excl. in GC? --    No data found.  Updated Vital Signs BP 124/69 (BP Location: Right Arm)   Pulse 73   Temp 99.1 F (37.3 C) (Oral)   Resp 18   LMP 12/03/2020   SpO2 100%   Visual Acuity Right Eye Distance:   Left Eye Distance:   Bilateral Distance:    Right Eye Near:   Left Eye Near:    Bilateral Near:     Physical Exam Vitals reviewed.  Constitutional:      General: She is not in acute distress.    Appearance: Normal appearance. She is not ill-appearing or diaphoretic.  HENT:     Head: Normocephalic and atraumatic.  Cardiovascular:     Rate and Rhythm: Normal rate and regular rhythm.     Heart sounds: Normal heart sounds.  Pulmonary:     Effort: Pulmonary effort is normal.     Breath sounds: Normal breath sounds.  Skin:    General: Skin is warm.     Comments: L medial ankle with 1cm round ulceration with surrounding erythema and tenderness. No discharge expressed.   Neurological:     General: No focal deficit present.     Mental Status:  She is alert and oriented to person, place, and time.  Psychiatric:        Mood and Affect: Mood normal.        Behavior: Behavior normal.        Thought Content: Thought content normal.        Judgment: Judgment normal.       UC Treatments / Results  Labs (all labs ordered are listed, but only abnormal results are displayed) Labs Reviewed - No data to display  EKG   Radiology No results found.  Procedures Procedures (including critical care time)  Medications Ordered in UC Medications - No data to display  Initial Impression / Assessment and Plan / UC Course  I have reviewed the triage vital signs and the nursing notes.  Pertinent labs & imaging results that were available during my care of the patient were reviewed by me and considered in my medical decision making (see chart for details).     This patient is a very pleasant 25 y.o. year old female presenting with ulceration L ankle x2 weeks. Afebrile, nontachy.  Symptoms actually started with an infection to the dorsal aspect of her left foot about 1 month ago, which she has been followed by podiatry for.  Has completed 2 rounds of doxycycline and 1 round of ciprofloxacin.  They were not able to identify cause of the infection, though it did begin after a trip to the [place].  She is not a diabetic. At her last podiatry visit 8/3, she was advised that if the infection got worse she would have to go to the emergency department for IV antibiotics. States she is not pregnant or breast-feeding.    Patient states Xray of the area 2 weeks ago was normal; I do not have access to these results. Xray L ankle ordered today but not completed as patient declined this in favor of heading to ED, which I am in agreement with. She understands she may have osteomyelitis requiring IV abx, and that she must head straight to the ED.  Final Clinical Impressions(s) / UC Diagnoses   Final diagnoses:  Skin ulcer, unspecified ulcer stage [facility])      Discharge Instructions      -I am concerned you have a deep  soft tissue and bone infection called osteomyelitis. This infection can get bad really quickly, even leading to amputation. Given the extent and progressively worsening infection, I'm concerned you require IV antibiotics at this time. Please head to [facility] or [facility] ED for this workup. It will likely involve additional imaging and bloodwork before the diagnosis is made.      ED Prescriptions   None    PDMP not reviewed this encounter.   Rhys Martini, PA-C 12/20/20 1842

## 2020-12-20 NOTE — ED Triage Notes (Signed)
Pt presents with a wound on back of left leg X 2 weeks with no injury involvement.

## 2020-12-20 NOTE — Discharge Instructions (Addendum)
-  I am concerned you have a deep soft tissue and bone infection called osteomyelitis. This infection can get bad really quickly, even leading to amputation. Given the extent and progressively worsening infection, I'm concerned you require IV antibiotics at this time. Please head to [facility] or [facility] ED for this workup. It will likely involve additional imaging and bloodwork before the diagnosis is made.

## 2020-12-20 NOTE — ED Triage Notes (Signed)
Pt states that she has been dealing with a wound infection x 2 weeks to her left foot and leg. Pt went to urgent care today and states that they told her to come to the Emergency room for IV antibiotics because the oral antibiotics are not working.

## 2020-12-20 NOTE — ED Provider Notes (Signed)
Emergency Medicine Provider Triage Evaluation Note  Ellen Patrick , a 25 y.o. female  was evaluated in triage.  Pt complains of wounds to left foot and ankle.  Patient reports that wounds have been present for multiple weeks, wounds developed after she returned to a chair from the [place].  Patient reports that she has seen podiatrist and was started on antibiotics.  Patient reports that wounds on her foot have gradually improved however wound on her ankle has gradually gotten worse.  Patient endorses "cloudy," discharge from wound.  Patient denies any fevers, chills, numbness, weakness, rash, nausea, vomiting.  Per chart review patient saw Dr. Allena Katz with podiatry on 8/3 was started on doxycycline, Cipro, and terbinafine.  Patient reports compliant with medication.  Patient was seen at urgent care earlier today and sent to the emergency department for concerns of osteomyelitis.  Review of Systems  Positive: Wounds Negative: fevers, chills, numbness, weakness, rash, nausea, vomiting  Physical Exam  BP (!) 147/91 (BP Location: Left Arm)   Pulse 76   Temp 99 F (37.2 C) (Oral)   Resp 16   Ht 5\' 6"  (1.676 m)   Wt 102.1 kg   LMP 12/03/2020   SpO2 100%   BMI 36.32 kg/m  Gen:   Awake, no distress   Resp:  Normal effort  MSK:   Moves extremities without difficulty Other:  +2 left DP pulse, patient has full range of motion to all digits of left foot, multiple wounds noted to left foot and left ankle, minimal surrounding erythema, no purulent discharge.  Medical Decision Making  Medically screening exam initiated at 8:56 PM.  Appropriate orders placed.  Ellen Patrick was informed that the remainder of the evaluation will be completed by another provider, this initial triage assessment does not replace that evaluation, and the importance of remaining in the ED until their evaluation is complete.  The patient appears stable so that the remainder of the work up may be completed by another provider.       12/05/2020, PA-C 12/20/20 2100    12/22/20, DO 12/20/20 2107

## 2020-12-21 NOTE — ED Provider Notes (Signed)
[facility] Provider Note: Lowella Dell, MD, FACEP  CSN: [id number] MRN: [id number] ARRIVAL: 12/20/20 at 2000 ROOM: WA17/WA17   CHIEF COMPLAINT  Wound Infection   HISTORY OF PRESENT ILLNESS  12/21/20 3:37 AM Ellen Patrick is a 25 y.o. female who was diagnosed with Crohn's disease about 3 years ago.  About the same time she started having nodules formed in the skin of her lower legs.  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  She developed a number of nodules on her legs, primarily her left lower leg, about 2 weeks ago.  She was started on Cipro and doxycycline for suspected infection.  Subsequently the nodules have broken down into ulcerations which are moderately painful (6 out of 10), worse with palpation or movement.  She was seen at an urgent care yesterday and told to come to the emergency department because they thought she might need IV antibiotics.  She is not aware of having a fever with this.   Past Medical History:  Diagnosis Date   Crohn's colitis (HCC)    IBS (irritable bowel syndrome)     Past Surgical History:  Procedure Laterality Date   WISDOM TOOTH EXTRACTION  01/2013    Family History  Problem Relation Age of Onset   Leukemia Brother    Uterine cancer Paternal Aunt    Colon cancer Neg Hx     Social History   Tobacco Use   Smoking status: Never   Smokeless tobacco: Never  Substance Use Topics   Alcohol use: No    Alcohol/week: 0.0 standard drinks   Drug use: Yes    Types: Marijuana    Prior to Admission medications   Medication Sig Start Date End Date Taking? Authorizing Provider  IRON PO Take 65 mg by mouth daily.    [provider]  mesalamine (LIALDA) 1.2 g EC tablet take 4 tablets by mouth daily WITH BREAKFAST 04/09/16   Sherrilyn Rist, MD  Multiple Vitamin (MULTIVITAMIN) tablet Take 1 tablet by mouth daily.    [provider]  norgestimate-ethinyl estradiol (ORTHO-CYCLEN,SPRINTEC,PREVIFEM) 0.25-35 MG-MCG  tablet Take 1 tablet by mouth daily.    [provider]  terbinafine (LAMISIL) 250 MG tablet Take 1 tablet (250 mg total) by mouth daily. 12/06/20   Candelaria Stagers, DPM    Allergies Patient has no known allergies.   REVIEW OF SYSTEMS  Negative except as noted here or in the History of Present Illness.   PHYSICAL EXAMINATION  Initial Vital Signs Blood pressure 113/77, pulse 69, temperature 99 F (37.2 C), temperature source Oral, resp. rate 18, height 5\' 6"  (1.676 m), weight 102.1 kg, last menstrual period 12/03/2020, SpO2 100 %.  Examination General: Well-developed, well-nourished female in no acute distress; appearance consistent with age of record HENT: normocephalic; atraumatic Eyes: Normal appearance Neck: supple Heart: regular rate and rhythm Lungs: clear to auscultation bilaterally Abdomen: soft; nondistended; nontender Extremities: No deformity; full range of motion; pulses normal Neurologic: Awake, alert and oriented; motor function intact in all extremities and symmetric; no facial droop Skin: Warm and dry; ulcerations of skin of lower legs, primarily the left; no surrounding erythema or warmth:      Psychiatric: Normal mood and affect   RESULTS  Summary of this visit's results, reviewed and interpreted by myself:   EKG Interpretation  Date/Time:    Ventricular Rate:    PR Interval:    QRS Duration:   QT Interval:    QTC Calculation:  R Axis:     Text Interpretation:         Laboratory Studies: Results for orders placed or performed during the hospital encounter of 12/20/20 (from the past 24 hour(s))  CBC with Differential     Status: Abnormal   Collection Time: 12/20/20  9:57 PM  Result Value Ref Range   WBC 5.8 4.0 - 10.5 K/uL   RBC 4.16 3.87 - 5.11 MIL/uL   Hemoglobin 11.4 (L) 12.0 - 15.0 g/dL   HCT 41.2 87.8 - 67.6 %   MCV 86.5 80.0 - 100.0 fL   MCH 27.4 26.0 - 34.0 pg   MCHC 31.7 30.0 - 36.0 g/dL   RDW 72.0 94.7 - 09.6 %    Platelets 350 150 - 400 K/uL   nRBC 0.0 0.0 - 0.2 %   Neutrophils Relative % 52 %   Neutro Abs 3.1 1.7 - 7.7 K/uL   Lymphocytes Relative 38 %   Lymphs Abs 2.2 0.7 - 4.0 K/uL   Monocytes Relative 7 %   Monocytes Absolute 0.4 0.1 - 1.0 K/uL   Eosinophils Relative 2 %   Eosinophils Absolute 0.1 0.0 - 0.5 K/uL   Basophils Relative 1 %   Basophils Absolute 0.0 0.0 - 0.1 K/uL   Immature Granulocytes 0 %   Abs Immature Granulocytes 0.01 0.00 - 0.07 K/uL  Basic metabolic panel     Status: None   Collection Time: 12/20/20  9:57 PM  Result Value Ref Range   Sodium 138 135 - 145 mmol/L   Potassium 3.8 3.5 - 5.1 mmol/L   Chloride 102 98 - 111 mmol/L   CO2 28 22 - 32 mmol/L   Glucose, Bld 93 70 - 99 mg/dL   BUN 13 6 - 20 mg/dL   Creatinine, Ser 2.83 0.44 - 1.00 mg/dL   Calcium 9.4 8.9 - 66.2 mg/dL   GFR, Estimated >94 >76 mL/min   Anion gap 8 5 - 15  Lactic acid, plasma     Status: None   Collection Time: 12/20/20  9:57 PM  Result Value Ref Range   Lactic Acid, Venous 0.5 0.5 - 1.9 mmol/L  I-Stat Beta hCG blood, ED ([facility], [facility], [facility] only)     Status: None   Collection Time: 12/20/20 10:01 PM  Result Value Ref Range   I-stat hCG, quantitative <5.0 <5 mIU/mL   Comment 3           Imaging Studies: DG Ankle Complete Left  Result Date: 12/20/2020 CLINICAL DATA:  Wounds. Patient reports wound infection for 2 weeks with drainage. EXAM: LEFT ANKLE COMPLETE - 3+ VIEW COMPARISON:  None. FINDINGS: There is no evidence of fracture, dislocation, or joint effusion. No erosion, bony destruction, or periosteal reaction. Skin thickening with soft tissue defect posteriorly. No tracking soft tissue air. No radiopaque foreign body. IMPRESSION: Soft tissue defect posteriorly. No radiographic findings of osteomyelitis. Electronically Signed   By: Narda Rutherford M.D.   On: 12/20/2020 21:20   DG Foot Complete Left  Result Date: 12/20/2020 CLINICAL DATA:  Wounds. Patient reports wound infection for 2 weeks with  wound drainage. EXAM: LEFT FOOT - COMPLETE 3+ VIEW COMPARISON:  Foot radiograph 12/06/2020 FINDINGS: There is no evidence of fracture or dislocation. No erosion, periosteal reaction, or bony destruction. There is soft tissue edema over the dorsum of the forefoot. No soft tissue air or radiopaque foreign body. IMPRESSION: Soft tissue edema. No radiographic findings of osteomyelitis. Electronically Signed   By: Ivette Loyal.D.  On: 12/20/2020 21:20    ED COURSE and MDM  Nursing notes, initial and subsequent vitals signs, including pulse oximetry, reviewed and interpreted by myself.  Vitals:   12/20/20 2044 12/20/20 2343 12/21/20 0100 12/21/20 0430  BP: (!) 147/91 126/72 113/77 (!) 141/74  Pulse: 76 71 69 77  Resp: 16 18 18 18   Temp: 99 F (37.2 C)     TempSrc: Oral     SpO2: 100% 98% 100% 99%  Weight: 102.1 kg     Height: 5\' 6"  (1.676 m)      Medications - No data to display  Given the patient's history of Crohn's and the progression of nodules into ulcerations is suspicious for pyoderma gangrenosum.  It has an association with inflammatory bowel disease.  It would also explain why these lesions have not improved with antibiotics.  She was advised that pyoderma gangrenosum diagnosis and treatment are beyond the scope of the emergency department.  We will refer to the wound care center and to dermatology for further evaluation and treatment.  She was advised that the wounds do not appear infected and she does not need IV antibiotics at this time.  It is not unreasonable for her to continue oral antibiotics to prevent secondary infection but if this is pyoderma gangrenosum she will need specialized immunotherapy treatment.  PROCEDURES  Procedures   ED DIAGNOSES     ICD-10-CM   1. Pyoderma gangrenosum  L88          Dinesha Twiggs, , MD 12/21/20 [phone]

## 2020-12-27 ENCOUNTER — Ambulatory Visit: Payer: Commercial Managed Care - PPO | Admitting: Podiatry

## 2021-02-01 ENCOUNTER — Other Ambulatory Visit: Payer: Self-pay

## 2021-02-01 ENCOUNTER — Encounter (HOSPITAL_BASED_OUTPATIENT_CLINIC_OR_DEPARTMENT_OTHER): Payer: Commercial Managed Care - PPO | Attending: Internal Medicine | Admitting: Internal Medicine

## 2021-02-01 DIAGNOSIS — L97818 Non-pressure chronic ulcer of other part of right lower leg with other specified severity: Secondary | ICD-10-CM | POA: Diagnosis not present

## 2021-02-01 DIAGNOSIS — K5090 Crohn's disease, unspecified, without complications: Secondary | ICD-10-CM | POA: Diagnosis not present

## 2021-02-01 DIAGNOSIS — L97528 Non-pressure chronic ulcer of other part of left foot with other specified severity: Secondary | ICD-10-CM | POA: Diagnosis not present

## 2021-02-01 NOTE — Progress Notes (Signed)
MORGANN, WOODBURN ([id number]) Visit Report for 02/01/2021 Abuse/Suicide Risk Screen Details Patient Name: Date of Service: FO RD, Fayrene Helper EN L. 02/01/2021 1:15 PM Medical Record Number: [id number] Patient Account Number: [id number] Date of Birth/Sex: Treating RN: 09-May-1995 (25 y.o. Ardis Rowan, Lauren Primary Care Benedicta Sultan: PA Zenovia Jordan, NO Other Clinician: Referring Batsheva Stevick: Treating Ardyce Heyer/Extender: Valentino Saxon in Treatment: 0 Abuse/Suicide Risk Screen Items Answer ABUSE RISK SCREEN: Has anyone close to you tried to hurt or harm you recentlyo No Do you feel uncomfortable with anyone in your familyo No Has anyone forced you do things that you didnt want to doo No Electronic Signature(s) Signed: 02/01/2021 4:45:58 PM By: Fonnie Mu RN Entered By: Fonnie Mu on 02/01/2021 13:24:51 -------------------------------------------------------------------------------- Activities of Daily Living Details Patient Name: Date of Service: FO RD, RA V EN L. 02/01/2021 1:15 PM Medical Record Number: [id number] Patient Account Number: [id number] Date of Birth/Sex: Treating RN: Sep 11, 1995 (25 y.o. Ardis Rowan, Lauren Primary Care Janiylah Hannis: PA Zenovia Jordan, NO Other Clinician: Referring Yury Schaus: Treating Jalia Zuniga/Extender: Valentino Saxon in Treatment: 0 Activities of Daily Living Items Answer Activities of Daily Living (Please select one for each item) Drive Automobile Completely Able T Medications ake Completely Able Use T elephone Completely Able Care for Appearance Completely Able Use T oilet Completely Able Bath / Shower Completely Able Dress Self Completely Able Feed Self Completely Able Walk Completely Able Get In / Out Bed Completely Able Housework Completely Able Prepare Meals Completely Able Handle Money Completely Able Shop for Self Completely Able Electronic Signature(s) Signed: 02/01/2021 4:45:58 PM By: Fonnie Mu RN Entered By: Fonnie Mu on  02/01/2021 13:25:16 -------------------------------------------------------------------------------- Education Screening Details Patient Name: Date of Service: FO RD, RA V EN L. 02/01/2021 1:15 PM Medical Record Number: [id number] Patient Account Number: [id number] Date of Birth/Sex: Treating RN: 11-10-1995 (25 y.o. Ardis Rowan, Lauren Primary Care Vanessia Bokhari: PA Zenovia Jordan, NO Other Clinician: Referring Gimena Buick: Treating Diantha Paxson/Extender: Valentino Saxon in Treatment: 0 Primary Learner Assessed: Patient Learning Preferences/Education Level/Primary Language Learning Preference: Explanation, Demonstration, Video, Communication Board, Printed Material Highest Education Level: College or Above Preferred Language: English Cognitive Barrier Language Barrier: No Translator Needed: No Memory Deficit: No Emotional Barrier: No Cultural/Religious Beliefs Affecting Medical Care: No Physical Barrier Impaired Vision: No Impaired Hearing: No Decreased Hand dexterity: No Knowledge/Comprehension Knowledge Level: High Comprehension Level: High Ability to understand written instructions: High Ability to understand verbal instructions: High Motivation Anxiety Level: Calm Cooperation: Cooperative Education Importance: Denies Need Interest in Health Problems: Asks Questions Perception: Coherent Willingness to Engage in Self-Management High Activities: Readiness to Engage in Self-Management High Activities: Electronic Signature(s) Signed: 02/01/2021 4:45:58 PM By: Fonnie Mu RN Entered By: Fonnie Mu on 02/01/2021 13:27:36 -------------------------------------------------------------------------------- Fall Risk Assessment Details Patient Name: Date of Service: FO RD, RA V EN L. 02/01/2021 1:15 PM Medical Record Number: [id number] Patient Account Number: [id number] Date of Birth/Sex: Treating RN: 03-16-1996 (25 y.o. Ardis Rowan, Lauren Primary Care Rehana Uncapher: PA TIENT,  NO Other Clinician: Referring Anira Senegal: Treating Danette Weinfeld/Extender: Valentino Saxon in Treatment: 0 Fall Risk Assessment Items Have you had 2 or more falls in the last 12 monthso 0 No Have you had any fall that resulted in injury in the last 12 monthso 0 No FALLS RISK SCREEN History of falling - immediate or within 3 months 0 No Secondary diagnosis (Do you have 2 or more medical diagnoseso) 0 No Ambulatory aid None/bed rest/wheelchair/nurse 0 No Crutches/cane/walker 0 No Furniture 0 No Intravenous therapy Access/Saline/Heparin Lock 0 No Gait/Transferring Normal/ bed rest/ wheelchair 0  No Weak (short steps with or without shuffle, stooped but able to lift head while walking, may seek 0 No support from furniture) Impaired (short steps with shuffle, may have difficulty arising from chair, head down, impaired 0 No balance) Mental Status Oriented to own ability 0 No Electronic Signature(s) Signed: 02/01/2021 4:45:58 PM By: Fonnie Mu RN Entered By: Fonnie Mu on 02/01/2021 13:25:26 -------------------------------------------------------------------------------- Foot Assessment Details Patient Name: Date of Service: FO RD, RA V EN L. 02/01/2021 1:15 PM Medical Record Number: [id number] Patient Account Number: [id number] Date of Birth/Sex: Treating RN: 05-29-1995 (25 y.o. Ardis Rowan, Lauren Primary Care Jordyne Poehlman: PA TIENT, NO Other Clinician: Referring Ardath Lepak: Treating Blaire Hodsdon/Extender: Valentino Saxon in Treatment: 0 Foot Assessment Items Site Locations + = Sensation present, - = Sensation absent, C = Callus, U = Ulcer R = Redness, W = Warmth, M = Maceration, PU = Pre-ulcerative lesion F = Fissure, S = Swelling, D = Dryness Assessment Right: Left: Other Deformity: No No Prior Foot Ulcer: No No Prior Amputation: No No Charcot Joint: No No Ambulatory Status: Ambulatory Without Help Gait: Steady Electronic Signature(s) Signed: 02/01/2021 4:45:58  PM By: Fonnie Mu RN Entered By: Fonnie Mu on 02/01/2021 13:39:38 -------------------------------------------------------------------------------- Nutrition Risk Screening Details Patient Name: Date of Service: FO RD, RA V EN L. 02/01/2021 1:15 PM Medical Record Number: [id number] Patient Account Number: [id number] Date of Birth/Sex: Treating RN: January 17, 1996 (25 y.o. Ardis Rowan, Lauren Primary Care Wade Asebedo: PA TIENT, NO Other Clinician: Referring Vidyuth Belsito: Treating Shemika Robbs/Extender: Valentino Saxon in Treatment: 0 Height (in): 66 Weight (lbs): 230 Body Mass Index (BMI): 37.1 Nutrition Risk Screening Items Score Screening NUTRITION RISK SCREEN: I have an illness or condition that made me change the kind and/or amount of food I eat 0 No I eat fewer than two meals per day 0 No I eat few fruits and vegetables, or milk products 0 No I have three or more drinks of beer, liquor or wine almost every day 0 No I have tooth or mouth problems that make it hard for me to eat 0 No I don't always have enough money to buy the food I need 0 No I eat alone most of the time 0 No I take three or more different prescribed or over-the-counter drugs a day 0 No Without wanting to, I have lost or gained 10 pounds in the last six months 0 No I am not always physically able to shop, cook and/or feed myself 0 No Nutrition Protocols Good Risk Protocol 0 No interventions needed Moderate Risk Protocol High Risk Proctocol Risk Level: Good Risk Score: 0 Electronic Signature(s) Signed: 02/01/2021 4:45:58 PM By: Fonnie Mu RN Entered By: Fonnie Mu on 02/01/2021 13:25:36

## 2021-02-01 NOTE — Progress Notes (Signed)
EMEREE, MAHLER ([id number]) Visit Report for 02/01/2021 Allergy List Details Patient Name: Date of Service: Ellen Patrick, Ellen Helper EN L. 02/01/2021 1:15 PM Medical Record Number: [id number] Patient Account Number: [id number] Date of Birth/Sex: Treating RN: 07/26/1995 (25 y.o. Toniann Fail Primary Care Deryl Giroux: PA Zenovia Jordan, [place] Other Clinician: Referring Phoua Hoadley: Treating Rahmah Mccamy/Extender: Valentino Saxon in Treatment: 0 Allergies Active Allergies No Known Allergies Allergy Notes Electronic Signature(s) Signed: 02/01/2021 4:45:58 PM By: Fonnie Mu RN Entered By: Fonnie Mu on 02/01/2021 13:23:23 -------------------------------------------------------------------------------- Arrival Information Details Patient Name: Date of Service: Ellen Patrick, Ellen V EN L. 02/01/2021 1:15 PM Medical Record Number: [id number] Patient Account Number: [id number] Date of Birth/Sex: Treating RN: 1995/10/05 (25 y.o. Ardis Rowan, Lauren Primary Care Kaikoa Magro: PA Zenovia Jordan, [place] Other Clinician: Referring Omid Deardorff: Treating Aleana Fifita/Extender: Valentino Saxon in Treatment: 0 Visit Information Patient Arrived: Ambulatory Arrival Time: 13:21 Accompanied By: self Transfer Assistance: None Patient Identification Verified: Yes Secondary Verification Process Completed: Yes Patient Requires Transmission-Based Precautions: No Patient Has Alerts: No Electronic Signature(s) Signed: 02/01/2021 4:45:58 PM By: Fonnie Mu RN Entered By: Fonnie Mu on 02/01/2021 13:21:52 -------------------------------------------------------------------------------- Clinic Level of Care Assessment Details Patient Name: Date of Service: Ellen Patrick, Ellen V EN L. 02/01/2021 1:15 PM Medical Record Number: [id number] Patient Account Number: [id number] Date of Birth/Sex: Treating RN: Jun 21, 1995 (25 y.o. Ardis Rowan, Lauren Primary Care Lastacia Solum: PA Zenovia Jordan, [place] Other Clinician: Referring Phila Shoaf: Treating Thaily Hackworth/Extender:  Valentino Saxon in Treatment: 0 Clinic Level of Care Assessment Items TOOL 1 Quantity Score X- 1 0 Use when EandM and Procedure is performed on INITIAL visit ASSESSMENTS - Nursing Assessment / Reassessment X- 1 20 General Physical Exam (combine w/ comprehensive assessment (listed just below) when performed on new pt. evals) X- 1 25 Comprehensive Assessment (HX, ROS, Risk Assessments, Wounds Hx, etc.) ASSESSMENTS - Wound and Skin Assessment / Reassessment X- 1 10 Dermatologic / Skin Assessment (not related to wound area) ASSESSMENTS - Ostomy and/or Continence Assessment and Care  - 0 Incontinence Assessment and Management  - 0 Ostomy Care Assessment and Management (repouching, etc.) PROCESS - Coordination of Care  - 0 Simple Patient / Family Education for ongoing care X- 1 20 Complex (extensive) Patient / Family Education for ongoing care X- 1 10 Staff obtains Chiropractor, Records, T Results / Process Orders est  - 0 Staff telephones HHA, Nursing Homes / Clarify orders / etc  - 0 Routine Transfer to another Facility (non-emergent condition)  - 0 Routine Hospital Admission (non-emergent condition) X- 1 15 New Admissions / Manufacturing engineer / Ordering NPWT Apligraf, etc. ,  - 0 Emergency Hospital Admission (emergent condition) PROCESS - Special Needs  - 0 Pediatric / Minor Patient Management  - 0 Isolation Patient Management  - 0 Hearing / Language / Visual special needs  - 0 Assessment of Community assistance (transportation, D/C planning, etc.)  - 0 Additional assistance / Altered mentation  - 0 Support Surface(s) Assessment (bed, cushion, seat, etc.) INTERVENTIONS - Miscellaneous  - 0 External ear exam  - 0 Patient Transfer (multiple staff / Nurse, adult / Similar devices)  - 0 Simple Staple / Suture removal (25 or less)  - 0 Complex Staple / Suture removal (26 or more)  - 0 Hypo/Hyperglycemic Management (do not  check if billed separately) X- 1 15 Ankle / Brachial Index (ABI) - do not check if billed separately Has the patient been seen at the hospital within the last three years: Yes Total Score: 115 Level Of Care: New/Established - Level  3 Electronic Signature(s) Signed: 02/01/2021 4:45:58 PM By: Fonnie Mu RN Entered By: Fonnie Mu on 02/01/2021 14:10:50 -------------------------------------------------------------------------------- Encounter Discharge Information Details Patient Name: Date of Service: Ellen Patrick, Ellen V EN L. 02/01/2021 1:15 PM Medical Record Number: [id number] Patient Account Number: [id number] Date of Birth/Sex: Treating RN: 1995/05/28 (25 y.o. Ardis Rowan, Lauren Primary Care Makana Feigel: PA Zenovia Jordan, [place] Other Clinician: Referring Surabhi Gadea: Treating Elene Downum/Extender: Valentino Saxon in Treatment: 0 Encounter Discharge Information Items Post Procedure Vitals Discharge Condition: Stable Temperature (F): 97.4 Ambulatory Status: Ambulatory Pulse (bpm): 74 Discharge Destination: Home Respiratory Rate (breaths/min): 17 Transportation: Private Auto Blood Pressure (mmHg): 134/74 Accompanied By: self Schedule Follow-up Appointment: Yes Clinical Summary of Care: Electronic Signature(s) Signed: 02/01/2021 4:45:58 PM By: Fonnie Mu RN Entered By: Fonnie Mu on 02/01/2021 14:28:12 -------------------------------------------------------------------------------- Lower Extremity Assessment Details Patient Name: Date of Service: Ellen Patrick, Ellen V EN L. 02/01/2021 1:15 PM Medical Record Number: [id number] Patient Account Number: [id number] Date of Birth/Sex: Treating RN: 1995/09/10 (25 y.o. Ardis Rowan, Lauren Primary Care Sander Speckman: PA TIENT, NO Other Clinician: Referring Stephene Alegria: Treating Nayeli Calvert/Extender: Valentino Saxon in Treatment: 0 Edema Assessment Assessed: [Left: Yes] [Right: Yes] Edema: [Left: Yes] [Right: Yes] Calf Left:  Right: Point of Measurement: 31 cm From Medial Instep 42 cm 42 cm Ankle Left: Right: Point of Measurement: 10 cm From Medial Instep 23 cm 23 cm Knee To Floor Left: Right: From Medial Instep 39 cm Vascular Assessment Pulses: Dorsalis Pedis Palpable: [Left:Yes] [Right:Yes] Posterior Tibial Palpable: [Left:Yes] [Right:Yes] Blood Pressure: Brachial: [Left:118] [Right:118] Ankle: [Left:Dorsalis Pedis: 148] [Right:Dorsalis Pedis: 120] Ankle Brachial Index: [Left:1.25] [Right:1.02] Electronic Signature(s) Signed: 02/01/2021 4:45:58 PM By: Fonnie Mu RN Entered By: Fonnie Mu on 02/01/2021 13:37:11 -------------------------------------------------------------------------------- Multi Wound Chart Details Patient Name: Date of Service: Ellen Patrick, Ellen V EN L. 02/01/2021 1:15 PM Medical Record Number: [id number] Patient Account Number: [id number] Date of Birth/Sex: Treating RN: [birth year] (25 y.o. [name], [place] Primary Care Gualberto Wahlen: PA TIENT, NO Other Clinician: Referring Loman Logan: Treating Hadriel Northup/Extender: Valentino Saxon in Treatment: 0 Photos: Right, Anterior Lower Leg Right, Medial Lower Leg Left, Posterior Lower Leg Wound Location: Gradually Appeared Gradually Appeared Gradually Appeared Wounding Event: T be determined o T be determined o T be determined o Primary Etiology: Crohns Crohns Crohns Comorbid History: 01/04/2021 01/04/2021 01/04/2021 Date Acquired: 0 0 0 Weeks of Treatment: Open Open Open Wound Status: 1.1x1x0.2 2.5x1x0.1 1.7x1x0.2 Measurements L x W x D (cm) 0.864 1.963 1.335 A (cm) : rea 0.173 0.196 0.267 Volume (cm) : 0.00% 0.00% 0.00% % Reduction in A rea: 0.00% 0.00% 0.00% % Reduction in Volume: Full Thickness With Exposed Support Full Thickness With Exposed Support Full Thickness With Exposed Support Classification: Structures Structures Structures Medium Medium Medium Exudate A mount: Serosanguineous Serosanguineous  Serosanguineous Exudate Type: red, brown red, brown red, brown Exudate Color: Distinct, outline attached Distinct, outline attached Distinct, outline attached Wound Margin: Medium (34-66%) Medium (34-66%) Medium (34-66%) Granulation A mount: Red, Pink Red, Pink Red, Pink Granulation Quality: Medium (34-66%) Medium (34-66%) Medium (34-66%) Necrotic A mount: Fat Layer (Subcutaneous Tissue): Yes Fat Layer (Subcutaneous Tissue): Yes Fat Layer (Subcutaneous Tissue): Yes Exposed Structures: Fascia: No Fascia: No Fascia: No Tendon: No Tendon: No Tendon: No Muscle: No Muscle: No Muscle: No Joint: No Joint: No Joint: No Bone: No Bone: No Bone: No None None None Epithelialization: Debridement - Excisional N/A Debridement - Excisional Debridement: Pre-procedure Verification/Time Out 14:00 N/A 14:00 Taken: Lidocaine N/A Lidocaine Pain Control: Subcutaneous, Slough N/A Subcutaneous, Slough Tissue Debrided: Skin/Subcutaneous Tissue N/A  Skin/Subcutaneous Tissue Level: 1.1 N/A 1.7 Debridement A (sq cm): rea Curette N/A Curette Instrument: Minimum N/A Minimum Bleeding: Pressure N/A Pressure Hemostasis A chieved: 0 N/A 0 Procedural Pain: 0 N/A 0 Post Procedural Pain: Procedure was tolerated well N/A Procedure was tolerated well Debridement Treatment Response: 1.1x1x0.2 N/A 1.7x1x0.2 Post Debridement Measurements L x W x D (cm) 0.173 N/A 0.267 Post Debridement Volume: (cm) Debridement N/A Debridement Procedures Performed: Wound Number: 4 N/A N/A Photos: N/A N/A Left T Second oe N/A N/A Wound Location: Gradually Appeared N/A N/A Wounding Event: T be determined o N/A N/A Primary Etiology: Crohns N/A N/A Comorbid History: 01/04/2021 N/A N/A Date Acquired: 0 N/A N/A Weeks of Treatment: Open N/A N/A Wound Status: 0.5x0.5x0.2 N/A N/A Measurements L x W x D (cm) 0.196 N/A N/A A (cm) : rea 0.039 N/A N/A Volume (cm) : 0.00% N/A N/A % Reduction in A  rea: 0.00% N/A N/A % Reduction in Volume: Full Thickness With Exposed Support N/A N/A Classification: Structures Medium N/A N/A Exudate A mount: Serosanguineous N/A N/A Exudate Type: red, brown N/A N/A Exudate Color: Distinct, outline attached N/A N/A Wound Margin: Medium (34-66%) N/A N/A Granulation A mount: Red, Pink N/A N/A Granulation Quality: Medium (34-66%) N/A N/A Necrotic A mount: Fascia: No N/A N/A Exposed Structures: Fat Layer (Subcutaneous Tissue): No Tendon: No Muscle: No Joint: No Bone: No None N/A N/A Epithelialization: Debridement - Excisional N/A N/A Debridement: Pre-procedure Verification/Time Out 14:00 N/A N/A Taken: Lidocaine N/A N/A Pain Control: Subcutaneous, Slough N/A N/A Tissue Debrided: Skin/Subcutaneous Tissue N/A N/A Level: 0.25 N/A N/A Debridement A (sq cm): rea Curette N/A N/A Instrument: Minimum N/A N/A Bleeding: Pressure N/A N/A Hemostasis A chieved: 0 N/A N/A Procedural Pain: 0 N/A N/A Post Procedural Pain: Procedure was tolerated well N/A N/A Debridement Treatment Response: 0.5x0.5x0.2 N/A N/A Post Debridement Measurements L x W x D (cm) 0.039 N/A N/A Post Debridement Volume: (cm) Debridement N/A N/A Procedures Performed: Treatment Notes Wound #1 (Lower Leg) Wound Laterality: Right, Anterior Cleanser Soap and Water Discharge Instruction: May shower and wash wound with dial antibacterial soap and water prior to dressing change. Wound Cleanser Discharge Instruction: Cleanse the wound with wound cleanser prior to applying a clean dressing using gauze sponges, not tissue or cotton balls. Peri-Wound Care Topical Primary Dressing KerraCel Ag Gelling Fiber Dressing, 4x5 in (silver alginate) Discharge Instruction: Apply silver alginate to wound bed as instructed Secondary Dressing Woven Gauze Sponge, Non-Sterile 4x4 in Discharge Instruction: Apply over primary dressing as directed. Bordered Gauze, 4x4 in Discharge  Instruction: Apply over primary dressing as directed. Secured With Compression Wrap Compression Stockings Add-Ons Wound #2 (Lower Leg) Wound Laterality: Right, Medial Cleanser Soap and Water Discharge Instruction: May shower and wash wound with dial antibacterial soap and water prior to dressing change. Wound Cleanser Discharge Instruction: Cleanse the wound with wound cleanser prior to applying a clean dressing using gauze sponges, not tissue or cotton balls. Peri-Wound Care Topical Primary Dressing KerraCel Ag Gelling Fiber Dressing, 4x5 in (silver alginate) Discharge Instruction: Apply silver alginate to wound bed as instructed Secondary Dressing Woven Gauze Sponge, Non-Sterile 4x4 in Discharge Instruction: Apply over primary dressing as directed. Bordered Gauze, 4x4 in Discharge Instruction: Apply over primary dressing as directed. Secured With Compression Wrap Compression Stockings Add-Ons Wound #3 (Lower Leg) Wound Laterality: Left, Posterior Cleanser Soap and Water Discharge Instruction: May shower and wash wound with dial antibacterial soap and water prior to dressing change. Wound Cleanser Discharge Instruction: Cleanse the wound with wound cleanser prior  to applying a clean dressing using gauze sponges, not tissue or cotton balls. Peri-Wound Care Topical Primary Dressing KerraCel Ag Gelling Fiber Dressing, 4x5 in (silver alginate) Discharge Instruction: Apply silver alginate to wound bed as instructed Secondary Dressing Woven Gauze Sponge, Non-Sterile 4x4 in Discharge Instruction: Apply over primary dressing as directed. Bordered Gauze, 4x4 in Discharge Instruction: Apply over primary dressing as directed. Secured With Compression Wrap Compression Stockings Add-Ons Wound #4 (Toe Second) Wound Laterality: Left Cleanser Soap and Water Discharge Instruction: May shower and wash wound with dial antibacterial soap and water prior to dressing change. Wound  Cleanser Discharge Instruction: Cleanse the wound with wound cleanser prior to applying a clean dressing using gauze sponges, not tissue or cotton balls. Peri-Wound Care Topical Primary Dressing KerraCel Ag Gelling Fiber Dressing, 4x5 in (silver alginate) Discharge Instruction: Apply silver alginate to wound bed as instructed Secondary Dressing Woven Gauze Sponge, Non-Sterile 4x4 in Discharge Instruction: Apply over primary dressing as directed. Bordered Gauze, 4x4 in Discharge Instruction: Apply over primary dressing as directed. Secured With Compression Wrap Compression Stockings Facilities manager) Signed: 02/01/2021 2:40:47 PM By: Baltazar Najjar MD Signed: 02/01/2021 2:50:47 PM By: Shawn Stall RN, BSN Entered By: Baltazar Najjar on 02/01/2021 14:40:46 -------------------------------------------------------------------------------- Multi-Disciplinary Care Plan Details Patient Name: Date of Service: Ellen Patrick, Ellen V EN L. 02/01/2021 1:15 PM Medical Record Number: [id number] Patient Account Number: [id number] Date of Birth/Sex: Treating RN: 1996/01/10 (25 y.o. Ardis Rowan, Lauren Primary Care Ily Denno: PA Zenovia Jordan, [place] Other Clinician: Referring Sissi Padia: Treating Dolphus Linch/Extender: Valentino Saxon in Treatment: 0 Active Inactive Orientation to the Wound Care Program Nursing Diagnoses: Knowledge deficit related to the wound healing center program Goals: Patient/caregiver will verbalize understanding of the Wound Healing Center Program Date Initiated: 02/01/2021 Target Resolution Date: 01/04/2021 Goal Status: Active Interventions: Provide education on orientation to the wound center Notes: Wound/Skin Impairment Nursing Diagnoses: Impaired tissue integrity Knowledge deficit related to ulceration/compromised skin integrity Goals: Patient will have a decrease in wound volume by X% from date: (specify in notes) Date Initiated: 02/01/2021 Target Resolution Date:  02/06/2021 Goal Status: Active Patient/caregiver will verbalize understanding of skin care regimen Date Initiated: 02/01/2021 Target Resolution Date: 02/09/2021 Goal Status: Active Ulcer/skin breakdown will have a volume reduction of 30% by week 4 Date Initiated: 02/01/2021 Target Resolution Date: 02/09/2021 Goal Status: Active Interventions: Assess patient/caregiver ability to obtain necessary supplies Assess patient/caregiver ability to perform ulcer/skin care regimen upon admission and as needed Assess ulceration(s) every visit Notes: Electronic Signature(s) Signed: 02/01/2021 4:45:58 PM By: Fonnie Mu RN Entered By: Fonnie Mu on 02/01/2021 14:06:03 -------------------------------------------------------------------------------- Pain Assessment Details Patient Name: Date of Service: Ellen Patrick, Ellen V EN L. 02/01/2021 1:15 PM Medical Record Number: [id number] Patient Account Number: [id number] Date of Birth/Sex: Treating RN: 10-01-1995 (25 y.o. Ardis Rowan, Lauren Primary Care Sachin Ferencz: PA Zenovia Jordan, NO Other Clinician: Referring Keats Kingry: Treating Bellany Elbaum/Extender: Valentino Saxon in Treatment: 0 Active Problems Location of Pain Severity and Description of Pain Patient Has Paino Yes Site Locations Pain Location: Pain in Ulcers With Dressing Change: Yes Duration of the Pain. Constant / Intermittento Intermittent Rate the pain. Current Pain Level: 2 Worst Pain Level: 10 Least Pain Level: 0 Tolerable Pain Level: 2 Character of Pain Describe the Pain: Aching Pain Management and Medication Current Pain Management: Medication: No Cold Application: No Rest: No Massage: No Activity: No T.E.N.S.: No Heat Application: No Leg drop or elevation: No Is the Current Pain Management Adequate: Adequate How does your wound impact your activities of daily livingo Sleep:  No Bathing: No Appetite: No Relationship With Others: No Bladder Continence: No Emotions:  No Bowel Continence: No Work: No Toileting: No Drive: No Dressing: No Hobbies: No Electronic Signature(s) Signed: 02/01/2021 4:45:58 PM By: Fonnie Mu RN Entered By: Fonnie Mu on 02/01/2021 13:26:19 -------------------------------------------------------------------------------- Patient/Caregiver Education Details Patient Name: Date of Service: Ellen Patrick, Ellen V EN L. 9/29/2022andnbsp1:15 PM Medical Record Number: [id number] Patient Account Number: [id number] Date of Birth/Gender: Treating RN: [birth year] (25 y.o. Toniann Fail Primary Care Physician: PA Zenovia Jordan, [place] Other Clinician: Referring Physician: Treating Physician/Extender: Valentino Saxon in Treatment: 0 Education Assessment Education Provided To: Patient Education Topics Provided Welcome T The [facility]: o Methods: Explain/Verbal Responses: State content correctly Electronic Signature(s) Signed: 02/01/2021 4:45:58 PM By: Fonnie Mu RN Entered By: Fonnie Mu on 02/01/2021 14:06:19 -------------------------------------------------------------------------------- Wound Assessment Details Patient Name: Date of Service: Ellen Patrick, Ellen V EN L. 02/01/2021 1:15 PM Medical Record Number: [id number] Patient Account Number: [id number] Date of Birth/Sex: Treating RN: 1995-07-12 (25 y.o. Ardis Rowan, Lauren Primary Care Joshuah Minella: PA TIENT, NO Other Clinician: Referring Birdie Fetty: Treating Turner Kunzman/Extender: Valentino Saxon in Treatment: 0 Wound Status Wound Number: 1 Primary Etiology: T be determined o Wound Location: Right, Anterior Lower Leg Wound Status: Open Wounding Event: Gradually Appeared Comorbid History: Crohns Date Acquired: 01/04/2021 Weeks Of Treatment: 0 Clustered Wound: No Photos Wound Measurements Length: (cm) 1.1 Width: (cm) 1 Depth: (cm) 0.2 Area: (cm) 0.864 Volume: (cm) 0.173 % Reduction in Area: 0% % Reduction in Volume: 0% Epithelialization:  None Tunneling: No Undermining: No Wound Description Classification: Full Thickness With Exposed Support Structures Wound Margin: Distinct, outline attached Exudate Amount: Medium Exudate Type: Serosanguineous Exudate Color: red, brown Foul Odor After Cleansing: No Slough/Fibrino Yes Wound Bed Granulation Amount: Medium (34-66%) Exposed Structure Granulation Quality: Red, Pink Fascia Exposed: No Necrotic Amount: Medium (34-66%) Fat Layer (Subcutaneous Tissue) Exposed: Yes Necrotic Quality: Adherent Slough Tendon Exposed: No Muscle Exposed: No Joint Exposed: No Bone Exposed: No Treatment Notes Wound #1 (Lower Leg) Wound Laterality: Right, Anterior Cleanser Soap and Water Discharge Instruction: May shower and wash wound with dial antibacterial soap and water prior to dressing change. Wound Cleanser Discharge Instruction: Cleanse the wound with wound cleanser prior to applying a clean dressing using gauze sponges, not tissue or cotton balls. Peri-Wound Care Topical Primary Dressing KerraCel Ag Gelling Fiber Dressing, 4x5 in (silver alginate) Discharge Instruction: Apply silver alginate to wound bed as instructed Secondary Dressing Woven Gauze Sponge, Non-Sterile 4x4 in Discharge Instruction: Apply over primary dressing as directed. Bordered Gauze, 4x4 in Discharge Instruction: Apply over primary dressing as directed. Secured With Compression Wrap Compression Stockings Facilities manager) Signed: 02/01/2021 4:45:58 PM By: Fonnie Mu RN Entered By: Fonnie Mu on 02/01/2021 13:37:50 -------------------------------------------------------------------------------- Wound Assessment Details Patient Name: Date of Service: Ellen Patrick, Ellen V EN L. 02/01/2021 1:15 PM Medical Record Number: [id number] Patient Account Number: [id number] Date of Birth/Sex: Treating RN: 1995/09/12 (25 y.o. Ardis Rowan, Lauren Primary Care Sekai Gitlin: PA TIENT, NO Other  Clinician: Referring Channin Agustin: Treating Jonice Cerra/Extender: Valentino Saxon in Treatment: 0 Wound Status Wound Number: 2 Primary Etiology: T be determined o Wound Location: Right, Medial Lower Leg Wound Status: Open Wounding Event: Gradually Appeared Comorbid History: Crohns Date Acquired: 01/04/2021 Weeks Of Treatment: 0 Clustered Wound: No Photos Wound Measurements Length: (cm) 2.5 Width: (cm) 1 Depth: (cm) 0.1 Area: (cm) 1.963 Volume: (cm) 0.196 % Reduction in Area: 0% % Reduction in Volume: 0% Epithelialization: None Tunneling: No Undermining: No Wound Description Classification: Full Thickness  With Exposed Support Structures Wound Margin: Distinct, outline attached Exudate Amount: Medium Exudate Type: Serosanguineous Exudate Color: red, brown Foul Odor After Cleansing: No Slough/Fibrino Yes Wound Bed Granulation Amount: Medium (34-66%) Exposed Structure Granulation Quality: Red, Pink Fascia Exposed: No Necrotic Amount: Medium (34-66%) Fat Layer (Subcutaneous Tissue) Exposed: Yes Necrotic Quality: Adherent Slough Tendon Exposed: No Muscle Exposed: No Joint Exposed: No Bone Exposed: No Treatment Notes Wound #2 (Lower Leg) Wound Laterality: Right, Medial Cleanser Soap and Water Discharge Instruction: May shower and wash wound with dial antibacterial soap and water prior to dressing change. Wound Cleanser Discharge Instruction: Cleanse the wound with wound cleanser prior to applying a clean dressing using gauze sponges, not tissue or cotton balls. Peri-Wound Care Topical Primary Dressing KerraCel Ag Gelling Fiber Dressing, 4x5 in (silver alginate) Discharge Instruction: Apply silver alginate to wound bed as instructed Secondary Dressing Woven Gauze Sponge, Non-Sterile 4x4 in Discharge Instruction: Apply over primary dressing as directed. Bordered Gauze, 4x4 in Discharge Instruction: Apply over primary dressing as directed. Secured With Compression  Wrap Compression Stockings Facilities manager) Signed: 02/01/2021 4:45:58 PM By: Fonnie Mu RN Entered By: Fonnie Mu on 02/01/2021 13:38:25 -------------------------------------------------------------------------------- Wound Assessment Details Patient Name: Date of Service: Ellen Patrick, Ellen V EN L. 02/01/2021 1:15 PM Medical Record Number: [id number] Patient Account Number: [id number] Date of Birth/Sex: Treating RN: February 21, 1996 (25 y.o. Ardis Rowan, Lauren Primary Care Paquita Printy: PA TIENT, NO Other Clinician: Referring Emrah Ariola: Treating Sotiria Keast/Extender: Valentino Saxon in Treatment: 0 Wound Status Wound Number: 3 Primary Etiology: T be determined o Wound Location: Left, Posterior Lower Leg Wound Status: Open Wounding Event: Gradually Appeared Comorbid History: Crohns Date Acquired: 01/04/2021 Weeks Of Treatment: 0 Clustered Wound: No Photos Wound Measurements Length: (cm) 1.7 Width: (cm) 1 Depth: (cm) 0.2 Area: (cm) 1.335 Volume: (cm) 0.267 % Reduction in Area: 0% % Reduction in Volume: 0% Epithelialization: None Tunneling: No Undermining: No Wound Description Classification: Full Thickness With Exposed Support Structures Wound Margin: Distinct, outline attached Exudate Amount: Medium Exudate Type: Serosanguineous Exudate Color: red, brown Wound Bed Granulation Amount: Medium (34-66%) Granulation Quality: Red, Pink Necrotic Amount: Medium (34-66%) Necrotic Quality: Adherent Slough Foul Odor After Cleansing: No Slough/Fibrino Yes Exposed Structure Fascia Exposed: No Fat Layer (Subcutaneous Tissue) Exposed: Yes Tendon Exposed: No Muscle Exposed: No Joint Exposed: No Bone Exposed: No Treatment Notes Wound #3 (Lower Leg) Wound Laterality: Left, Posterior Cleanser Soap and Water Discharge Instruction: May shower and wash wound with dial antibacterial soap and water prior to dressing change. Wound Cleanser Discharge Instruction:  Cleanse the wound with wound cleanser prior to applying a clean dressing using gauze sponges, not tissue or cotton balls. Peri-Wound Care Topical Primary Dressing KerraCel Ag Gelling Fiber Dressing, 4x5 in (silver alginate) Discharge Instruction: Apply silver alginate to wound bed as instructed Secondary Dressing Woven Gauze Sponge, Non-Sterile 4x4 in Discharge Instruction: Apply over primary dressing as directed. Bordered Gauze, 4x4 in Discharge Instruction: Apply over primary dressing as directed. Secured With Compression Wrap Compression Stockings Facilities manager) Signed: 02/01/2021 4:45:58 PM By: Fonnie Mu RN Entered By: Fonnie Mu on 02/01/2021 13:39:11 -------------------------------------------------------------------------------- Wound Assessment Details Patient Name: Date of Service: Ellen Patrick, Ellen V EN L. 02/01/2021 1:15 PM Medical Record Number: [id number] Patient Account Number: [id number] Date of Birth/Sex: Treating RN: [birth year] (25 y.o. Ardis Rowan, Lauren Primary Care Amaziah Ghosh: PA Zenovia [place], [place] Other Clinician: Referring Lourine Alberico: Treating Airik Goodlin/Extender: Valentino Saxon in Treatment: 0 Wound Status Wound Number: 4 Primary Etiology: T be determined o Wound Location: Left T Second  oe Wound Status: Open Wounding Event: Gradually Appeared Comorbid History: Crohns Date Acquired: 01/04/2021 Weeks Of Treatment: 0 Clustered Wound: No Photos Wound Measurements Length: (cm) 0.5 Width: (cm) 0.5 Depth: (cm) 0.2 Area: (cm) 0.196 Volume: (cm) 0.039 % Reduction in Area: 0% % Reduction in Volume: 0% Epithelialization: None Tunneling: No Undermining: No Wound Description Classification: Full Thickness With Exposed Support Structures Wound Margin: Distinct, outline attached Exudate Amount: Medium Exudate Type: Serosanguineous Exudate Color: red, brown Foul Odor After Cleansing: No Slough/Fibrino Yes Wound Bed Granulation  Amount: Medium (34-66%) Exposed Structure Granulation Quality: Red, Pink Fascia Exposed: No Necrotic Amount: Medium (34-66%) Fat Layer (Subcutaneous Tissue) Exposed: No Necrotic Quality: Adherent Slough Tendon Exposed: No Muscle Exposed: No Joint Exposed: No Bone Exposed: No Treatment Notes Wound #4 (Toe Second) Wound Laterality: Left Cleanser Soap and Water Discharge Instruction: May shower and wash wound with dial antibacterial soap and water prior to dressing change. Wound Cleanser Discharge Instruction: Cleanse the wound with wound cleanser prior to applying a clean dressing using gauze sponges, not tissue or cotton balls. Peri-Wound Care Topical Primary Dressing KerraCel Ag Gelling Fiber Dressing, 4x5 in (silver alginate) Discharge Instruction: Apply silver alginate to wound bed as instructed Secondary Dressing Woven Gauze Sponge, Non-Sterile 4x4 in Discharge Instruction: Apply over primary dressing as directed. Bordered Gauze, 4x4 in Discharge Instruction: Apply over primary dressing as directed. Secured With Compression Wrap Compression Stockings Facilities manager) Signed: 02/01/2021 4:45:58 PM By: Fonnie Mu RN Entered By: Fonnie Mu on 02/01/2021 13:39:38 -------------------------------------------------------------------------------- Vitals Details Patient Name: Date of Service: Ellen Patrick, Ellen V EN L. 02/01/2021 1:15 PM Medical Record Number: [id number] Patient Account Number: [id number] Date of Birth/Sex: Treating RN: 22-Sep-1995 (25 y.o. Ardis Rowan, Lauren Primary Care Jakeel Starliper: PA Zenovia Jordan, [place] Other Clinician: Referring Allani Reber: Treating Farron Lafond/Extender: Valentino Saxon in Treatment: 0 Vital Signs Time Taken: 13:22 Reference Range: 80 - 120 mg / dl Height (in): 66 Source: Stated Weight (lbs): 230 Source: Stated Body Mass Index (BMI): 37.1 Electronic Signature(s) Signed: 02/01/2021 4:45:58 PM By: Fonnie Mu  RN Entered By: Fonnie Mu on 02/01/2021 13:22:29

## 2021-02-02 NOTE — Progress Notes (Signed)
Ellen Patrick, Ellen Patrick ([id number]) Visit Report for 02/01/2021 Chief Complaint Document Details Patient Name: Date of Service: Ellen Alert EN L. 02/01/2021 1:15 PM Medical Record Number: [id number] Patient Account Number: [id number] Date of Birth/Sex: Treating RN: December 07, 1995 (25 y.o. Ellen Patrick Primary Care Provider: PA Zenovia Jordan, [place] Other Clinician: Referring Provider: Treating Provider/Extender: Valentino Saxon in Treatment: 0 Information Obtained from: Patient Chief Complaint 02/01/2021; the patient has wound areas on her bilateral lower legs in the complex setting of a history of Crohn's disease and subcutaneous nodules. She was referred from the emergency room Electronic Signature(s) Signed: 02/01/2021 2:42:59 PM By: Baltazar Najjar MD Entered By: Baltazar Najjar on 02/01/2021 14:42:59 -------------------------------------------------------------------------------- Debridement Details Patient Name: Date of Service: FO RD, RA V EN L. 02/01/2021 1:15 PM Medical Record Number: [id number] Patient Account Number: [id number] Date of Birth/Sex: Treating RN: [birth year] (25 y.o. Ellen Patrick, [place] Primary Care Provider: PA TIENT, [place] Other Clinician: Referring Provider: Treating Provider/Extender: Valentino Saxon in Treatment: 0 Debridement Performed for Assessment: Wound #1 Right,Anterior Lower Leg Performed By: Physician Maxwell Caul., MD Debridement Type: Debridement Level of Consciousness (Pre-procedure): Awake and Alert Pre-procedure Verification/Time Out Yes - 14:00 Taken: Start Time: 14:00 Pain Control: Lidocaine T Area Debrided (L x W): otal 1.1 (cm) x 1 (cm) = 1.1 (cm) Tissue and other material debrided: Viable, Non-Viable, Slough, Subcutaneous, Skin: Dermis , Skin: Epidermis, Slough Level: Skin/Subcutaneous Tissue Debridement Description: Excisional Instrument: Curette Bleeding: Minimum Hemostasis Achieved: Pressure End Time: 14:00 Procedural Pain: 0 Post  Procedural Pain: 0 Response to Treatment: Procedure was tolerated well Level of Consciousness (Post- Awake and Alert procedure): Post Debridement Measurements of Total Wound Length: (cm) 1.1 Width: (cm) 1 Depth: (cm) 0.2 Volume: (cm) 0.173 Character of Wound/Ulcer Post Debridement: Improved Post Procedure Diagnosis Same as Pre-procedure Electronic Signature(s) Signed: 02/01/2021 2:41:23 PM By: Baltazar Najjar MD Signed: 02/01/2021 2:50:47 PM By: Shawn Stall RN, BSN Entered By: Baltazar Najjar on 02/01/2021 14:41:23 -------------------------------------------------------------------------------- Debridement Details Patient Name: Date of Service: FO RD, RA V EN L. 02/01/2021 1:15 PM Medical Record Number: [id number] Patient Account Number: [id number] Date of Birth/Sex: Treating RN: [birth year] (25 y.o. Ellen Patrick, [place] Primary Care Provider: PA TIENT, [place] Other Clinician: Referring Provider: Treating Provider/Extender: Valentino Saxon in Treatment: 0 Debridement Performed for Assessment: Wound #3 Left,Posterior Lower Leg Performed By: Physician Maxwell Caul., MD Debridement Type: Debridement Level of Consciousness (Pre-procedure): Awake and Alert Pre-procedure Verification/Time Out Yes - 14:00 Taken: Start Time: 14:00 Pain Control: Lidocaine T Area Debrided (L x W): otal 1.7 (cm) x 1 (cm) = 1.7 (cm) Tissue and other material debrided: Viable, Non-Viable, Slough, Subcutaneous, Skin: Dermis , Skin: Epidermis, Slough Level: Skin/Subcutaneous Tissue Debridement Description: Excisional Instrument: Curette Bleeding: Minimum Hemostasis Achieved: Pressure End Time: 14:00 Procedural Pain: 0 Post Procedural Pain: 0 Response to Treatment: Procedure was tolerated well Level of Consciousness (Post- Awake and Alert procedure): Post Debridement Measurements of Total Wound Length: (cm) 1.7 Width: (cm) 1 Depth: (cm) 0.2 Volume: (cm) 0.267 Character of Wound/Ulcer Post  Debridement: Improved Post Procedure Diagnosis Same as Pre-procedure Electronic Signature(s) Signed: 02/01/2021 2:41:31 PM By: Baltazar Najjar MD Signed: 02/01/2021 2:50:47 PM By: Shawn Stall RN, BSN Entered By: Baltazar Najjar on 02/01/2021 14:41:31 -------------------------------------------------------------------------------- Debridement Details Patient Name: Date of Service: FO RD, RA V EN L. 02/01/2021 1:15 PM Medical Record Number: [id number] Patient Account Number: [id number] Date of Birth/Sex: Treating RN: 04/16/1996 (25 y.o. Ellen Patrick Primary Care Provider: PA Zenovia Jordan, [place] Other Clinician: Referring Provider: Treating  Provider/Extender: Valentino Saxon in Treatment: 0 Debridement Performed for Assessment: Wound #4 Left T Second oe Performed By: Physician Maxwell Caul., MD Debridement Type: Debridement Level of Consciousness (Pre-procedure): Awake and Alert Pre-procedure Verification/Time Out Yes - 14:00 Taken: Start Time: 14:00 Pain Control: Lidocaine T Area Debrided (L x W): otal 0.5 (cm) x 0.5 (cm) = 0.25 (cm) Tissue and other material debrided: Viable, Non-Viable, Slough, Subcutaneous, Skin: Dermis , Skin: Epidermis, Slough Level: Skin/Subcutaneous Tissue Debridement Description: Excisional Instrument: Curette Bleeding: Minimum Hemostasis Achieved: Pressure End Time: 14:00 Procedural Pain: 0 Post Procedural Pain: 0 Response to Treatment: Procedure was tolerated well Level of Consciousness (Post- Awake and Alert procedure): Post Debridement Measurements of Total Wound Length: (cm) 0.5 Width: (cm) 0.5 Depth: (cm) 0.2 Volume: (cm) 0.039 Character of Wound/Ulcer Post Debridement: Improved Post Procedure Diagnosis Same as Pre-procedure Electronic Signature(s) Signed: 02/01/2021 2:41:39 PM By: Baltazar Najjar MD Signed: 02/01/2021 2:50:47 PM By: Shawn Stall RN, BSN Entered By: Baltazar Najjar on 02/01/2021  14:41:39 -------------------------------------------------------------------------------- HPI Details Patient Name: Date of Service: FO RD, RA V EN L. 02/01/2021 1:15 PM Medical Record Number: [id number] Patient Account Number: [id number] Date of Birth/Sex: Treating RN: 10-04-1995 (25 y.o. Ellen Patrick Primary Care Provider: PA Zenovia Jordan, [place] Other Clinician: Referring Provider: Treating Provider/Extender: Valentino Saxon in Treatment: 0 History of Present Illness HPI Description: ADMISSION 02/01/21; This is a 25 year old woman who had Crohn's disease or perhaps ulcerative colitis diagnosed by GI at [facility] some years ago. This was apparently confirmed at [facility] although the patient thinks they told her it was ulcerative colitis. The most recent notes I see are from 9 at 2020 at which times that they are quoting Crohn's disease. She was supposed to be on mesalamine but it was not really helping she stopped that and really has not had too much problems since. She denies diarrhea, abdominal pain bleeding or weight loss. She tells me that some years ago she started noticing subcutaneous nodules mostly on the inner part of both calves. These would come up and go down not usually open and not usually causing any pain. While vacationing in the [place] in August she developed open areas on both legs and the left second toe. She saw Dr. Allena Katz about the toe at which time he noted multiple pustules and put her on doxycycline and Cipro which she completed. Currently she has an open punched-out area on the right anterior tibia, left medial calf and a open area on the base of her left second toe. She shows me a nodule on the right lateral medial calf which is somewhat firm but nontender. On the left medial calf she has a small discolored area with a pustule. The open areas she says often started like this. She has not noticed any other skin issues The patient was in the ER on 8/17 and they referred  her here wondering about pyoderma gangrenosum. She also saw Dr. Allena Katz who noted multiple pustules in the left foot.. As noted put her on antibiotic Electronic Signature(s) Signed: 02/01/2021 2:51:10 PM By: Baltazar Najjar MD Previous Signature: 02/01/2021 2:47:42 PM Version By: Baltazar Najjar MD Entered By: Baltazar Najjar on 02/01/2021 14:51:09 -------------------------------------------------------------------------------- Physical Exam Details Patient Name: Date of Service: FO RD, RA V EN L. 02/01/2021 1:15 PM Medical Record Number: [id number] Patient Account Number: [id number] Date of Birth/Sex: Treating RN: Jul 24, 1995 (25 y.o. Ellen Patrick Primary Care Provider: PA Zenovia Jordan, [place] Other Clinician: Referring Provider: Treating Provider/Extender: Valentino Saxon in Treatment: 0  Constitutional The patient looks well. Appears well-nourished. Gastrointestinal (GI) Abdomen is soft and non-distended without masses or tenderness.. Notes Wound exam; the patient has small punched out areas on the right anterior upper tibia, left medial calf and a wound over the left dorsal second toe and proximal foot. These are not particularly painful no discoloration. On the left upper anterior she had a small pustule that I punctured and cultured although I did be surprised if this were positive. She also has me feel a palpable nodule on the right medial calf this was nontender. She says for years she has had things like this that have not opened. Electronic Signature(s) Signed: 02/01/2021 2:49:40 PM By: Baltazar Najjar MD Entered By: Baltazar Najjar on 02/01/2021 14:49:39 -------------------------------------------------------------------------------- Physician Orders Details Patient Name: Date of Service: FO RD, RA V EN L. 02/01/2021 1:15 PM Medical Record Number: [id number] Patient Account Number: [id number] Date of Birth/Sex: Treating RN: 1996/02/23 (25 y.o. Toniann Fail Primary Care  Provider: PA Zenovia Jordan, [place] Other Clinician: Referring Provider: Treating Provider/Extender: Valentino Saxon in Treatment: 0 Verbal / Phone Orders: No Diagnosis Coding Follow-up Appointments ppointment in 1 week. - Dr. Leanord Hawking Return A Bathing/ Shower/ Hygiene May shower with protection but do not get wound dressing(s) wet. Edema Control - Lymphedema / SCD / Other Elevate legs to the level of the heart or above for 30 minutes daily and/or when sitting, a frequency of: Avoid standing for long periods of time. Wound Treatment Wound #1 - Lower Leg Wound Laterality: Right, Anterior Cleanser: Soap and Water 1 x Per Day/15 Days Discharge Instructions: May shower and wash wound with dial antibacterial soap and water prior to dressing change. Cleanser: Wound Cleanser (DME) (Generic) 1 x Per Day/15 Days Discharge Instructions: Cleanse the wound with wound cleanser prior to applying a clean dressing using gauze sponges, not tissue or cotton balls. Prim Dressing: KerraCel Ag Gelling Fiber Dressing, 4x5 in (silver alginate) (DME) (Generic) 1 x Per Day/15 Days ary Discharge Instructions: Apply silver alginate to wound bed as instructed Secondary Dressing: Woven Gauze Sponge, Non-Sterile 4x4 in (DME) (Generic) 1 x Per Day/15 Days Discharge Instructions: Apply over primary dressing as directed. Secondary Dressing: Bordered Gauze, 4x4 in (DME) (Generic) 1 x Per Day/15 Days Discharge Instructions: Apply over primary dressing as directed. Wound #2 - Lower Leg Wound Laterality: Right, Medial Cleanser: Soap and Water Every Other Day/15 Days Discharge Instructions: May shower and wash wound with dial antibacterial soap and water prior to dressing change. Cleanser: Wound Cleanser (DME) (Generic) Every Other Day/15 Days Discharge Instructions: Cleanse the wound with wound cleanser prior to applying a clean dressing using gauze sponges, not tissue or cotton balls. Prim Dressing: KerraCel Ag Gelling Fiber  Dressing, 4x5 in (silver alginate) (DME) (Generic) Every Other Day/15 Days ary Discharge Instructions: Apply silver alginate to wound bed as instructed Secondary Dressing: Woven Gauze Sponge, Non-Sterile 4x4 in (DME) (Generic) Every Other Day/15 Days Discharge Instructions: Apply over primary dressing as directed. Secondary Dressing: Bordered Gauze, 4x4 in (DME) (Generic) Every Other Day/15 Days Discharge Instructions: Apply over primary dressing as directed. Wound #3 - Lower Leg Wound Laterality: Left, Posterior Cleanser: Soap and Water Every Other Day/15 Days Discharge Instructions: May shower and wash wound with dial antibacterial soap and water prior to dressing change. Cleanser: Wound Cleanser (DME) (Generic) Every Other Day/15 Days Discharge Instructions: Cleanse the wound with wound cleanser prior to applying a clean dressing using gauze sponges, not tissue or cotton balls. Prim Dressing: KerraCel Ag Gelling Fiber Dressing,  4x5 in (silver alginate) (DME) (Generic) Every Other Day/15 Days ary Discharge Instructions: Apply silver alginate to wound bed as instructed Secondary Dressing: Woven Gauze Sponge, Non-Sterile 4x4 in (DME) (Generic) Every Other Day/15 Days Discharge Instructions: Apply over primary dressing as directed. Secondary Dressing: Bordered Gauze, 4x4 in (DME) (Generic) Every Other Day/15 Days Discharge Instructions: Apply over primary dressing as directed. Wound #4 - T Second oe Wound Laterality: Left Cleanser: Soap and Water Every Other Day/15 Days Discharge Instructions: May shower and wash wound with dial antibacterial soap and water prior to dressing change. Cleanser: Wound Cleanser (DME) (Generic) Every Other Day/15 Days Discharge Instructions: Cleanse the wound with wound cleanser prior to applying a clean dressing using gauze sponges, not tissue or cotton balls. Prim Dressing: KerraCel Ag Gelling Fiber Dressing, 4x5 in (silver alginate) (DME) (Generic) Every Other  Day/15 Days ary Discharge Instructions: Apply silver alginate to wound bed as instructed Secondary Dressing: Woven Gauze Sponge, Non-Sterile 4x4 in (DME) (Generic) Every Other Day/15 Days Discharge Instructions: Apply over primary dressing as directed. Secondary Dressing: Bordered Gauze, 4x4 in (DME) (Generic) Every Other Day/15 Days Discharge Instructions: Apply over primary dressing as directed. Laboratory naerobe culture (MICRO) - leg Bacteria identified in Unspecified specimen by A LOINC Code: 635-3 Convenience Name: Anerobic culture Electronic Signature(s) Signed: 02/01/2021 4:45:58 PM By: Fonnie Mu RN Signed: 02/02/2021 3:46:44 PM By: Baltazar Najjar MD Entered By: Fonnie Mu on 02/01/2021 14:25:11 -------------------------------------------------------------------------------- Problem List Details Patient Name: Date of Service: FO RD, RA V EN L. 02/01/2021 1:15 PM Medical Record Number: [id number] Patient Account Number: [id number] Date of Birth/Sex: Treating RN: 1996-02-06 (25 y.o. 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 02/01/2021 1:15 PM Medical Record Number: [id number] Patient Account Number: [id number] Date of Birth/Sex: Treating RN: [birth year] (25 y.o. Ellen Patrick Primary Care Provider: PA Zenovia Jordan, [place] Other Clinician: Referring Provider: Treating Provider/Extender: Valentino Saxon in Treatment: 0 Subjective Chief Complaint Information obtained from Patient 02/01/2021; the patient has wound areas on her bilateral lower legs in the complex setting of a history of Crohn's disease and subcutaneous nodules. She was referred from the emergency room History of Present Illness (HPI) ADMISSION 02/01/21; This is a 25 year old woman who had Crohn's disease or perhaps ulcerative colitis diagnosed by GI at [facility] some years ago. This was apparently confirmed at [facility] although the patient thinks they told her it was ulcerative colitis. The most recent notes I see are from 9 at 2020 at which times that they are quoting Crohn's disease. She was supposed to be on mesalamine but it was not really helping she stopped that and really has not had too much problems since. She denies diarrhea, abdominal pain bleeding or weight loss. She tells me that some years ago she started noticing subcutaneous nodules mostly  on the inner part of both calves. These would come up and go down not usually open and not usually causing any pain. While vacationing in the [place] in August she developed open areas on both legs and the left second toe. She saw Dr. Allena Katz about the toe at which time he noted multiple pustules and put her on doxycycline and Cipro which she completed. Currently she has an open punched-out area on the right anterior tibia, left medial calf and a open area on the base of her left second toe. She shows me a nodule on the right lateral medial calf  which is somewhat firm but nontender. On the left medial calf she has a small discolored area with a pustule. The open areas she says often started like this. She has not noticed any other skin issues The patient was in the ER on 8/17 and they referred her here wondering about pyoderma gangrenosum. She also saw Dr. Allena Katz who noted multiple pustules in the left foot.. As noted put her on antibiotic Patient History Information obtained from Patient. Allergies No Known Allergies Family History Unknown History. Social History Never smoker, Marital Status - Single, Alcohol Use - Rarely, Drug Use - No History, Caffeine Use - Never. Medical History Gastrointestinal Patient has history of Crohnoos Denies history of Cirrhosis , Colitis, Hepatitis A, Hepatitis B, Hepatitis C Review of Systems (ROS) Constitutional Symptoms (General Health) Denies complaints or symptoms of Fatigue, Fever, Chills, Marked Weight Change. Eyes Denies complaints or symptoms of Dry Eyes, Vision Changes, Glasses / Contacts. Ear/Nose/Mouth/Throat Denies complaints or symptoms of Chronic sinus problems or rhinitis. Respiratory Denies complaints or symptoms of Chronic or frequent coughs, Shortness of Breath. Cardiovascular Denies complaints or symptoms of Chest pain. Gastrointestinal Denies complaints or symptoms of Frequent diarrhea, Nausea, Vomiting. Endocrine Denies complaints or symptoms of Heat/cold intolerance. Genitourinary Denies complaints or symptoms of Frequent urination. Integumentary (Skin) Complains or has symptoms of Wounds. Musculoskeletal Denies complaints or symptoms of Muscle Pain, Muscle Weakness. Neurologic Denies complaints or symptoms of Numbness/parasthesias. Psychiatric Denies complaints or symptoms of Claustrophobia, Suicidal. Objective Constitutional The patient looks well. Appears well-nourished. Vitals Time Taken: 1:22 PM, Height: 66 in, Source: Stated, Weight: 230 lbs, Source:  Stated, BMI: 37.1. Gastrointestinal (GI) Abdomen is soft and non-distended without masses or tenderness.. General Notes: Wound exam; the patient has small punched out areas on the right anterior upper tibia, left medial calf and a wound over the left dorsal second toe and proximal foot. These are not particularly painful no discoloration. On the left upper anterior she had a small pustule that I punctured and cultured although I did be surprised if this were positive. She also has me feel a palpable nodule on the right medial calf this was nontender. She says for years she has had things like this that have not opened. Integumentary (Hair, Skin) Wound #1 status is Open. Original cause of wound was Gradually Appeared. The date acquired was: 01/04/2021. The wound is located on the Right,Anterior Lower Leg. The wound measures 1.1cm length x 1cm width x 0.2cm depth; 0.864cm^2 area and 0.173cm^3 volume. There is Fat Layer (Subcutaneous Tissue) exposed. There is no tunneling or undermining noted. There is a medium amount of serosanguineous drainage noted. The wound margin is distinct with the outline attached to the wound base. There is medium (34-66%) red, pink granulation within the wound bed. There is a medium (34-66%) amount of necrotic tissue within the wound bed including Adherent Slough. Wound #2 status is Open. Original  cause of wound was Gradually Appeared. The date acquired was: 01/04/2021. The wound is located on the Right,Medial Lower Leg. The wound measures 2.5cm length x 1cm width x 0.1cm depth; 1.963cm^2 area and 0.196cm^3 volume. There is Fat Layer (Subcutaneous Tissue) exposed. There is no tunneling or undermining noted. There is a medium amount of serosanguineous drainage noted. The wound margin is distinct with the outline attached to the wound base. There is medium (34-66%) red, pink granulation within the wound bed. There is a medium (34-66%) amount of necrotic tissue within the wound bed  including Adherent Slough. Wound #3 status is Open. Original cause of wound was Gradually Appeared. The date acquired was: 01/04/2021. The wound is located on the Left,Posterior Lower Leg. The wound measures 1.7cm length x 1cm width x 0.2cm depth; 1.335cm^2 area and 0.267cm^3 volume. There is Fat Layer (Subcutaneous Tissue) exposed. There is no tunneling or undermining noted. There is a medium amount of serosanguineous drainage noted. The wound margin is distinct with the outline attached to the wound base. There is medium (34-66%) red, pink granulation within the wound bed. There is a medium (34-66%) amount of necrotic tissue within the wound bed including Adherent Slough. Wound #4 status is Open. Original cause of wound was Gradually Appeared. The date acquired was: 01/04/2021. The wound is located on the Left T Second. oe The wound measures 0.5cm length x 0.5cm width x 0.2cm depth; 0.196cm^2 area and 0.039cm^3 volume. There is no tunneling or undermining noted. There is a medium amount of serosanguineous drainage noted. The wound margin is distinct with the outline attached to the wound base. There is medium (34-66%) red, pink granulation within the wound bed. There is a medium (34-66%) amount of necrotic tissue within the wound bed including Adherent Slough. 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 There was a Excisional Skin/Subcutaneous Tissue o Debridement with a total area of 1.1 sq cm performed by Maxwell Caul., MD. With the following instrument(s): Curette  to remove Viable and Non-Viable tissue/material. Material removed includes Subcutaneous Tissue, Slough, Skin: Dermis, and Skin: Epidermis after achieving pain control using Lidocaine. No specimens were taken. A time out was conducted at 14:00, prior to the start of the procedure. A Minimum amount of bleeding was controlled with Pressure. The procedure was tolerated well with a pain level of 0 throughout and a pain level of 0 following the procedure. Post Debridement Measurements: 1.1cm length x 1cm width x 0.2cm depth; 0.173cm^3 volume. Character of Wound/Ulcer Post Debridement is improved. Post procedure Diagnosis Wound #1: Same as Pre-Procedure Wound #3 Pre-procedure diagnosis of Wound #3 is a T be determined located on the Left,Posterior Lower Leg . There was a Excisional Skin/Subcutaneous Tissue o Debridement with a total area of 1.7 sq cm performed by Maxwell Caul., MD. With the following instrument(s): Curette to remove Viable and Non-Viable tissue/material. Material removed includes Subcutaneous Tissue, Slough, Skin: Dermis, and Skin: Epidermis after achieving pain control using Lidocaine. No specimens were taken. A time out was conducted at 14:00, prior to the start of the procedure. A Minimum amount of bleeding was controlled with Pressure. The procedure  was tolerated well with a pain level of 0 throughout and a pain level of 0 following the procedure. Post Debridement Measurements: 1.7cm length x 1cm width x 0.2cm depth; 0.267cm^3 volume. Character of Wound/Ulcer Post Debridement is improved. Post procedure Diagnosis Wound #3: Same as Pre-Procedure Wound #4 Pre-procedure diagnosis of Wound #4 is a T be determined located on the Left T Second . There was a Excisional Skin/Subcutaneous Tissue Debridement o oe with a total area of 0.25 sq cm performed by Maxwell Caul., MD. With the following instrument(s): Curette to remove Viable and Non-Viable tissue/material. Material removed  includes Subcutaneous Tissue, Slough, Skin: Dermis, and Skin: Epidermis after achieving pain control using Lidocaine. No specimens were taken. A time out was conducted at 14:00, prior to the start of the procedure. A Minimum amount of bleeding was controlled with Pressure. The procedure was tolerated well with a pain level of 0 throughout and a pain level of 0 following the procedure. Post Debridement Measurements: 0.5cm length x 0.5cm width x 0.2cm depth; 0.039cm^3 volume. Character of Wound/Ulcer Post Debridement is improved. Post procedure Diagnosis Wound #4: Same as Pre-Procedure Plan Follow-up Appointments: Return Appointment in 1 week. - Dr. Leanord Hawking Bathing/ Shower/ Hygiene: May shower with protection but do not get wound dressing(s) wet. Edema Control - Lymphedema / SCD / Other: Elevate legs to the level of the heart or above for 30 minutes daily and/or when sitting, a frequency of: Avoid standing for long periods of time. Laboratory ordered were: Anerobic culture - leg WOUND #1: - Lower Leg Wound Laterality: Right, Anterior Cleanser: Soap and Water 1 x Per Day/15 Days Discharge Instructions: May shower and wash wound with dial antibacterial soap and water prior to dressing change. Cleanser: Wound Cleanser (DME) (Generic) 1 x Per Day/15 Days Discharge Instructions: Cleanse the wound with wound cleanser prior to applying a clean dressing using gauze sponges, not tissue or cotton balls. Prim Dressing: KerraCel Ag Gelling Fiber Dressing, 4x5 in (silver alginate) (DME) (Generic) 1 x Per Day/15 Days ary Discharge Instructions: Apply silver alginate to wound bed as instructed Secondary Dressing: Woven Gauze Sponge, Non-Sterile 4x4 in (DME) (Generic) 1 x Per Day/15 Days Discharge Instructions: Apply over primary dressing as directed. Secondary Dressing: Bordered Gauze, 4x4 in (DME) (Generic) 1 x Per Day/15 Days Discharge Instructions: Apply over primary dressing as directed. WOUND #2: -  Lower Leg Wound Laterality: Right, Medial Cleanser: Soap and Water Every Other Day/15 Days Discharge Instructions: May shower and wash wound with dial antibacterial soap and water prior to dressing change. Cleanser: Wound Cleanser (DME) (Generic) Every Other Day/15 Days Discharge Instructions: Cleanse the wound with wound cleanser prior to applying a clean dressing using gauze sponges, not tissue or cotton balls. Prim Dressing: KerraCel Ag Gelling Fiber Dressing, 4x5 in (silver alginate) (DME) (Generic) Every Other Day/15 Days ary Discharge Instructions: Apply silver alginate to wound bed as instructed Secondary Dressing: Woven Gauze Sponge, Non-Sterile 4x4 in (DME) (Generic) Every Other Day/15 Days Discharge Instructions: Apply over primary dressing as directed. Secondary Dressing: Bordered Gauze, 4x4 in (DME) (Generic) Every Other Day/15 Days Discharge Instructions: Apply over primary dressing as directed. WOUND #3: - Lower Leg Wound Laterality: Left, Posterior Cleanser: Soap and Water Every Other Day/15 Days Discharge Instructions: May shower and wash wound with dial antibacterial soap and water prior to dressing change. Cleanser: Wound Cleanser (DME) (Generic) Every Other Day/15 Days Discharge Instructions: Cleanse the wound with wound cleanser prior to applying a clean dressing using gauze sponges, not tissue or  cotton balls. Prim Dressing: KerraCel Ag Gelling Fiber Dressing, 4x5 in (silver alginate) (DME) (Generic) Every Other Day/15 Days ary Discharge Instructions: Apply silver alginate to wound bed as instructed Secondary Dressing: Woven Gauze Sponge, Non-Sterile 4x4 in (DME) (Generic) Every Other Day/15 Days Discharge Instructions: Apply over primary dressing as directed. Secondary Dressing: Bordered Gauze, 4x4 in (DME) (Generic) Every Other Day/15 Days Discharge Instructions: Apply over primary dressing as directed. WOUND #4: - T Second Wound Laterality: Left oe Cleanser: Soap and  Water Every Other Day/15 Days Discharge Instructions: May shower and wash wound with dial antibacterial soap and water prior to dressing change. Cleanser: Wound Cleanser (DME) (Generic) Every Other Day/15 Days Discharge Instructions: Cleanse the wound with wound cleanser prior to applying a clean dressing using gauze sponges, not tissue or cotton balls. Prim Dressing: KerraCel Ag Gelling Fiber Dressing, 4x5 in (silver alginate) (DME) (Generic) Every Other Day/15 Days ary Discharge Instructions: Apply silver alginate to wound bed as instructed Secondary Dressing: Woven Gauze Sponge, Non-Sterile 4x4 in (DME) (Generic) Every Other Day/15 Days Discharge Instructions: Apply over primary dressing as directed. Secondary Dressing: Bordered Gauze, 4x4 in (DME) (Generic) Every Other Day/15 Days Discharge Instructions: Apply over primary dressing as directed. 1. I am not sure what these represent. I am doubtful this is pyoderma gangrenosum which is a question I think that I am being asked. They are not painful inflamed looking. 2. In spite of the above I think it is possible that these areas might need to be biopsied. 3. I ruptured a pustule and cultured it but did not give her empiric antibiotics 4. Silver alginate to the open areas she is going to change these daily. 5. I think she might need to see dermatology. Some of the nodules might represent erythema nodosum but I have never seen ruptured areas like this Electronic Signature(s) Signed: 02/01/2021 2:52:55 PM By: Baltazar Najjar MD Entered By: Baltazar Najjar on 02/01/2021 14:52:55 -------------------------------------------------------------------------------- HxROS Details Patient Name: Date of Service: FO RD, RA V EN L. 02/01/2021 1:15 PM Medical Record Number: [id number] Patient Account Number: [id number] Date of Birth/Sex: Treating RN: 1995/12/03 (25 y.o. Toniann Fail Primary Care Provider: PA Zenovia Jordan, NO Other Clinician: Referring  Provider: Treating Provider/Extender: Valentino Saxon in Treatment: 0 Information Obtained From Patient Constitutional Symptoms (General Health) Complaints and Symptoms: Negative for: Fatigue; Fever; Chills; Marked Weight Change Eyes Complaints and Symptoms: Negative for: Dry Eyes; Vision Changes; Glasses / Contacts Ear/Nose/Mouth/Throat Complaints and Symptoms: Negative for: Chronic sinus problems or rhinitis Respiratory Complaints and Symptoms: Negative for: Chronic or frequent coughs; Shortness of Breath Cardiovascular Complaints and Symptoms: Negative for: Chest pain Gastrointestinal Complaints and Symptoms: Negative for: Frequent diarrhea; Nausea; Vomiting Medical History: Positive for: Crohns Negative for: Cirrhosis ; Colitis; Hepatitis A; Hepatitis B; Hepatitis C Endocrine Complaints and Symptoms: Negative for: Heat/cold intolerance Genitourinary Complaints and Symptoms: Negative for: Frequent urination Integumentary (Skin) Complaints and Symptoms: Positive for: Wounds Musculoskeletal Complaints and Symptoms: Negative for: Muscle Pain; Muscle Weakness Neurologic Complaints and Symptoms: Negative for: Numbness/parasthesias Psychiatric Complaints and Symptoms: Negative for: Claustrophobia; Suicidal Hematologic/Lymphatic Immunological Oncologic Immunizations Pneumococcal Vaccine: Received Pneumococcal Vaccination: No Implantable Devices None Family and Social History Unknown History: Yes; Never smoker; Marital Status - Single; Alcohol Use: Rarely; Drug Use: No History; Caffeine Use: Never; Financial Concerns: No; Food, Clothing or Shelter Needs: No; Support System Lacking: No; Transportation Concerns: No Electronic Signature(s) Signed: 02/01/2021 4:45:58 PM By: Fonnie Mu RN Signed: 02/02/2021 3:46:44 PM By: Baltazar Najjar MD Entered By: Fonnie Mu on  02/01/2021  13:24:45 -------------------------------------------------------------------------------- SuperBill Details Patient Name: Date of Service: FO RD, Fayrene Helper EN L. 02/01/2021 Medical Record Number: [id number] Patient Account Number: [id number] Date of Birth/Sex: Treating RN: [birth year] (25 y.o. 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 K50.918 Crohn's disease, unspecified, with other complication Facility Procedures CPT4 Code: [id number] Description: WOUND CARE VISIT-LEV 3 NEW PT Modifier: Quantity: 1 Physician Procedures : CPT4 Code Description Modifier [id number] WC PHYS LEVEL 3 NEW PT ICD-10 Diagnosis Description L97.818 Non-pressure chronic ulcer of other part of right lower leg with other specified severity L97.928 Non-pressure chronic ulcer of unspecified part of left  lower leg with other specified severity L97.528 Non-pressure chronic ulcer of other part of left foot with other specified severity K50.918 Crohn's disease, unspecified, with other complication Quantity: 1 Electronic Signature(s) Signed: 02/01/2021 2:53:19 PM By: Baltazar Najjar MD Entered By: Baltazar Najjar on 02/01/2021 14:53:19

## 2021-02-06 LAB — AEROBIC/ANAEROBIC CULTURE W GRAM STAIN (SURGICAL/DEEP WOUND)

## 2021-02-08 ENCOUNTER — Other Ambulatory Visit: Payer: Self-pay

## 2021-02-08 ENCOUNTER — Encounter (HOSPITAL_BASED_OUTPATIENT_CLINIC_OR_DEPARTMENT_OTHER): Payer: Commercial Managed Care - PPO | Attending: Internal Medicine | Admitting: Internal Medicine

## 2021-02-08 DIAGNOSIS — L97928 Non-pressure chronic ulcer of unspecified part of left lower leg with other specified severity: Secondary | ICD-10-CM | POA: Insufficient documentation

## 2021-02-08 DIAGNOSIS — L97818 Non-pressure chronic ulcer of other part of right lower leg with other specified severity: Secondary | ICD-10-CM | POA: Insufficient documentation

## 2021-02-08 DIAGNOSIS — L97528 Non-pressure chronic ulcer of other part of left foot with other specified severity: Secondary | ICD-10-CM | POA: Insufficient documentation

## 2021-02-08 DIAGNOSIS — K50918 Crohn's disease, unspecified, with other complication: Secondary | ICD-10-CM | POA: Diagnosis not present

## 2021-02-08 DIAGNOSIS — A4901 Methicillin susceptible Staphylococcus aureus infection, unspecified site: Secondary | ICD-10-CM | POA: Insufficient documentation

## 2021-02-08 DIAGNOSIS — L52 Erythema nodosum: Secondary | ICD-10-CM | POA: Diagnosis not present

## 2021-02-08 NOTE — Progress Notes (Signed)
RAMA, SORCI ([id number]) Visit Report for 02/08/2021 Arrival Information Details Patient Name: Date of Service: Ellen Patrick. 02/08/2021 3:15 PM Medical Record Number: [id number] Patient Account Number: [id number] Date of Birth/Sex: Treating RN: Ellen Patrick (25 y.o. Ellen Patrick, Ellen Patrick Primary Care Brady Plant: PA Haig [place], [place] Other Clinician: Referring Dwaine Pringle: Treating Bryn Saline/Extender: Solon Augusta in Treatment: 1 Visit Information History Since Last Visit Added or deleted any medications: No Patient Arrived: Ambulatory Any new allergies or adverse reactions: No Arrival Time: 16:23 Had a fall or experienced change in No Accompanied By: self activities of daily living that may affect Transfer Assistance: None risk of falls: Patient Identification Verified: Yes Signs or symptoms of abuse/neglect since last visito No Secondary Verification Process Completed: Yes Hospitalized since last visit: No Patient Requires Transmission-Based Precautions: No Implantable device outside of the clinic excluding No Patient Has Alerts: No cellular tissue based products placed in the center since last visit: Has Dressing in Place as Prescribed: Yes Pain Present Now: No Electronic Signature(s) Signed: 02/08/2021 6:17:04 PM By: Baruch Gouty RN, BSN Entered By: Baruch Gouty on 02/08/2021 16:25:05 -------------------------------------------------------------------------------- Clinic Level of Care Assessment Details Patient Name: Date of Service: FO RD, RA V EN Patrick. 02/08/2021 3:15 PM Medical Record Number: [id number] Patient Account Number: [id number] Date of Birth/Sex: Treating RN: Patrick[birth year] (25 y.o. Ellen Patrick Primary Care Luisdavid Hamblin: PA Haig [place], [place] Other Clinician: Referring Katrina Daddona: Treating Seville Downs/Extender: Solon Augusta in Treatment: 1 Clinic Level of Care Assessment Items TOOL 4 Quantity Score '[]'  - 0 Use when only an EandM is  performed on FOLLOW-UP visit ASSESSMENTS - Nursing Assessment / Reassessment X- 1 10 Reassessment of Co-morbidities (includes updates in patient status) X- 1 5 Reassessment of Adherence to Treatment Plan ASSESSMENTS - Wound and Skin A ssessment / Reassessment '[]'  - 0 Simple Wound Assessment / Reassessment - one wound X- 4 5 Complex Wound Assessment / Reassessment - multiple wounds '[]'  - 0 Dermatologic / Skin Assessment (not related to wound area) ASSESSMENTS - Focused Assessment '[]'  - 0 Circumferential Edema Measurements - multi extremities '[]'  - 0 Nutritional Assessment / Counseling / Intervention X- 1 5 Lower Extremity Assessment (monofilament, tuning fork, pulses) '[]'  - 0 Peripheral Arterial Disease Assessment (using hand held doppler) ASSESSMENTS - Ostomy and/or Continence Assessment and Care '[]'  - 0 Incontinence Assessment and Management '[]'  - 0 Ostomy Care Assessment and Management (repouching, etc.) PROCESS - Coordination of Care X - Simple Patient / Family Education for ongoing care 1 15 '[]'  - 0 Complex (extensive) Patient / Family Education for ongoing care X- 1 10 Staff obtains Programmer, systems, Records, T Results / Process Orders est '[]'  - 0 Staff telephones HHA, Nursing Homes / Clarify orders / etc '[]'  - 0 Routine Transfer to another Facility (non-emergent condition) '[]'  - 0 Routine Hospital Admission (non-emergent condition) '[]'  - 0 New Admissions / Biomedical engineer / Ordering NPWT Apligraf, etc. , '[]'  - 0 Emergency Hospital Admission (emergent condition) X- 1 10 Simple Discharge Coordination '[]'  - 0 Complex (extensive) Discharge Coordination PROCESS - Special Needs '[]'  - 0 Pediatric / Minor Patient Management '[]'  - 0 Isolation Patient Management '[]'  - 0 Hearing / Language / Visual special needs '[]'  - 0 Assessment of Community assistance (transportation, D/C planning, etc.) '[]'  - 0 Additional assistance / Altered mentation '[]'  - 0 Support Surface(s) Assessment  (bed, cushion, seat, etc.) INTERVENTIONS - Wound Cleansing / Measurement '[]'  - 0 Simple Wound Cleansing - one wound X- 4 5 Complex Wound  Cleansing - multiple wounds X- 1 5 Wound Imaging (photographs - any number of wounds) '[]'  - 0 Wound Tracing (instead of photographs) '[]'  - 0 Simple Wound Measurement - one wound X- 4 5 Complex Wound Measurement - multiple wounds INTERVENTIONS - Wound Dressings X - Small Wound Dressing one or multiple wounds 4 10 '[]'  - 0 Medium Wound Dressing one or multiple wounds '[]'  - 0 Large Wound Dressing one or multiple wounds '[]'  - 0 Application of Medications - topical '[]'  - 0 Application of Medications - injection INTERVENTIONS - Miscellaneous '[]'  - 0 External ear exam '[]'  - 0 Specimen Collection (cultures, biopsies, blood, body fluids, etc.) '[]'  - 0 Specimen(s) / Culture(s) sent or taken to Lab for analysis '[]'  - 0 Patient Transfer (multiple staff / Civil Service fast streamer / Similar devices) '[]'  - 0 Simple Staple / Suture removal (25 or less) '[]'  - 0 Complex Staple / Suture removal (26 or more) '[]'  - 0 Hypo / Hyperglycemic Management (close monitor of Blood Glucose) '[]'  - 0 Ankle / Brachial Index (ABI) - do not check if billed separately X- 1 5 Vital Signs Has the patient been seen at the hospital within the last three years: Yes Total Score: 165 Level Of Care: New/Established - Level 5 Electronic Signature(s) Signed: 02/08/2021 6:17:04 PM By: Baruch Gouty RN, BSN Entered By: Baruch Gouty on 02/08/2021 17:20:50 -------------------------------------------------------------------------------- Encounter Discharge Information Details Patient Name: Date of Service: FO RD, RA V EN Patrick. 02/08/2021 3:15 PM Medical Record Number: [id number] Patient Account Number: [id number] Date of Birth/Sex: Treating RN: [birth year] (25 y.o. Ellen Patrick Primary Care Amarah Brossman: PA Haig [place], [place] Other Clinician: Referring Ayaana Biondo: Treating Ivana Nicastro/Extender: Solon Augusta in Treatment: 1 Encounter Discharge Information Items Discharge Condition: Stable Ambulatory Status: Ambulatory Discharge Destination: Home Transportation: Private Auto Accompanied By: self Schedule Follow-up Appointment: Yes Clinical Summary of Care: Patient Declined Electronic Signature(s) Signed: 02/08/2021 6:17:04 PM By: Baruch Gouty RN, BSN Entered By: Baruch Gouty on 02/08/2021 17:41:30 -------------------------------------------------------------------------------- Lower Extremity Assessment Details Patient Name: Date of Service: FO RD, RA V EN Patrick. 02/08/2021 3:15 PM Medical Record Number: [id number] Patient Account Number: [id number] Date of Birth/Sex: Treating RN: Patrick[birth year] (25 y.o. Ellen Patrick Primary Care Mckenzey Parcell: PA Haig [place], [place] Other Clinician: Referring Dejah Droessler: Treating Vail Basista/Extender: Solon Augusta in Treatment: 1 Edema Assessment Assessed: [Left: No] [Right: No] Edema: [Left: Yes] [Right: Yes] Calf Left: Right: Point of Measurement: 31 cm From Medial Instep 42 cm 42 cm Ankle Left: Right: Point of Measurement: 10 cm From Medial Instep 23 cm 23 cm Vascular Assessment Pulses: Dorsalis Pedis Palpable: [Left:Yes] [Right:Yes] Electronic Signature(s) Signed: 02/08/2021 6:17:04 PM By: Baruch Gouty RN, BSN Entered By: Baruch Gouty on 02/08/2021 16:26:29 -------------------------------------------------------------------------------- Multi Wound Chart Details Patient Name: Date of Service: FO RD, RA V EN Patrick. 02/08/2021 3:15 PM Medical Record Number: [id number] Patient Account Number: [id number] Date of Birth/Sex: Treating RN: [birth year] (25 y.o. [name], Meta.Reding Primary Care Zykerria Tanton: PA [place], [place] Other Clinician: Referring Evian Derringer: Treating Mathilda Maguire/Extender: Solon Augusta in Treatment: 1 Vital Signs Height(in): 66 Pulse(bpm): 45 Weight(lbs): 230 Blood Pressure(mmHg): 122/78 Body  Mass Index(BMI): 37 Temperature(F): 98.8 Respiratory Rate(breaths/min): 18 Photos: Right, Anterior Lower Leg Left, Medial Lower Leg Left, Posterior Lower Leg Wound Location: Gradually Appeared Gradually Appeared Gradually Appeared Wounding Event: T be determined o Infection - not elsewhere classified T be determined o Primary Etiology: Crohns Crohns Crohns Comorbid History: 01/04/2021 01/04/2021 01/04/2021 Date Acquired: '1 1 1 ' Weeks of Treatment: Open  Open Open Wound Status: No No Yes Clustered Wound: N/A N/A 4 Clustered Quantity: 1.5x1x0.2 0.7x0.8x0.2 2.3x3.4x0.2 Measurements Patrick x W x D (cm) 1.178 0.44 6.142 A (cm) : rea 0.236 0.088 1.228 Volume (cm) : -36.30% 77.60% -360.10% % Reduction in A rea: -36.40% 55.10% -359.90% % Reduction in Volume: Full Thickness With Exposed Support Full Thickness With Exposed Support Full Thickness With Exposed Support Classification: Structures Structures Structures Medium Medium Medium Exudate Amount: Serosanguineous Serosanguineous Serosanguineous Exudate Type: red, brown red, brown red, brown Exudate Color: Distinct, outline attached Distinct, outline attached Distinct, outline attached Wound Margin: Medium (34-66%) Large (67-100%) Medium (34-66%) Granulation Amount: Red, Pink Red Red, Pink Granulation Quality: Medium (34-66%) Small (1-33%) Medium (34-66%) Necrotic Amount: Fat Layer (Subcutaneous Tissue): Yes Fat Layer (Subcutaneous Tissue): Yes Fat Layer (Subcutaneous Tissue): Yes Exposed Structures: Fascia: No Fascia: No Fascia: No Tendon: No Tendon: No Tendon: No Muscle: No Muscle: No Muscle: No Joint: No Joint: No Joint: No Bone: No Bone: No Bone: No None None None Epithelialization: Wound Number: 4 N/A N/A Photos: N/A N/A Left T Second oe N/A N/A Wound Location: Gradually Appeared N/A N/A Wounding Event: T be determined o N/A N/A Primary Etiology: Crohns N/A N/A Comorbid History: 01/04/2021 N/A  N/A Date Acquired: 1 N/A N/A Weeks of Treatment: Open N/A N/A Wound Status: Yes N/A N/A Clustered Wound: 2 N/A N/A Clustered Quantity: 1.4x0.7x0.1 N/A N/A Measurements Patrick x W x D (cm) 0.77 N/A N/A A (cm) : rea 0.077 N/A N/A Volume (cm) : -292.90% N/A N/A % Reduction in A rea: -97.40% N/A N/A % Reduction in Volume: Full Thickness With Exposed Support N/A N/A Classification: Structures Medium N/A N/A Exudate Amount: Serosanguineous N/A N/A Exudate Type: red, brown N/A N/A Exudate Color: Distinct, outline attached N/A N/A Wound Margin: Medium (34-66%) N/A N/A Granulation Amount: Red, Pink N/A N/A Granulation Quality: Medium (34-66%) N/A N/A Necrotic Amount: Fat Layer (Subcutaneous Tissue): Yes N/A N/A Exposed Structures: Fascia: No Tendon: No Muscle: No Joint: No Bone: No None N/A N/A Epithelialization: Treatment Notes Electronic Signature(s) Signed: 02/08/2021 5:24:53 PM By: Linton Ham MD Signed: 02/08/2021 6:25:33 PM By: Deon Pilling RN, BSN Entered By: Linton Ham on 02/08/2021 17:24:52 -------------------------------------------------------------------------------- Multi-Disciplinary Care Plan Details Patient Name: Date of Service: FO RD, RA V EN Patrick. 02/08/2021 3:15 PM Medical Record Number: [id number] Patient Account Number: [id number] Date of Birth/Sex: Treating RN: [birth year] (25 y.o. Ellen Patrick Primary Care Tanylah Schnoebelen: PA [place], [place] Other Clinician: Referring Raveen Wieseler: Treating Chandler Stofer/Extender: Solon Augusta in Treatment: 1 [place] reviewed with physician Active Inactive Wound/Skin Impairment Nursing Diagnoses: Impaired tissue integrity Knowledge deficit related to ulceration/compromised skin integrity Goals: Patient will have a decrease in wound volume by X% from date: (specify in notes) Date Initiated: 02/01/2021 Date Inactivated: 02/08/2021 Target Resolution Date: 02/06/2021 Goal  Status: Met Patient/caregiver will verbalize understanding of skin care regimen Date Initiated: 02/01/2021 Target Resolution Date: 03/01/2021 Goal Status: Active Ulcer/skin breakdown will have a volume reduction of 30% by week 4 Date Initiated: 02/01/2021 Target Resolution Date: 03/01/2021 Goal Status: Active Interventions: Assess patient/caregiver ability to obtain necessary supplies Assess patient/caregiver ability to perform ulcer/skin care regimen upon admission and as needed Assess ulceration(s) every visit Notes: Electronic Signature(s) Signed: 02/08/2021 6:17:04 PM By: Baruch Gouty RN, BSN Entered By: Baruch Gouty on 02/08/2021 16:57:33 -------------------------------------------------------------------------------- Pain Assessment Details Patient Name: Date of Service: FO RD, RA V EN Patrick. 02/08/2021 3:15 PM Medical Record Number: [id number] Patient Account Number: [id number] Date of Birth/Sex: Treating RN:  Patrick[birth year] (25 y.o. Ellen Patrick Primary Care Luretha Eberly: PA [place]ig [place], [place] Other Clinician: Referring Eula Jaster: Treating Dalonda Simoni/Extender: Solon Augusta in Treatment: 1 Active Problems Location of Pain Severity and Description of Pain Patient Has Paino No Site Locations Rate the pain. Current Pain Level: 0 Pain Management and Medication Current Pain Management: Electronic Signature(s) Signed: 02/08/2021 6:17:04 PM By: Baruch Gouty RN, BSN Entered By: Baruch Gouty on 02/08/2021 16:26:04 -------------------------------------------------------------------------------- Patient/Caregiver Education Details Patient Name: Date of Service: FO RD, Lisabeth Register 10/6/2022andnbsp3:15 PM Medical Record Number: [id number] Patient Account Number: [id number] Date of Birth/Gender: Treating RN: Patrick[birth year] (25 y.o. Ellen Patrick Primary Care Physician: PA Haig [place], [place] Other Clinician: Referring Physician: Treating Physician/Extender: Solon Augusta in Treatment: 1 Education Assessment Education Provided To: Patient Education Topics Provided Infection: Methods: Explain/Verbal Responses: Reinforcements needed, State content correctly Wound/Skin Impairment: Methods: Explain/Verbal Responses: Reinforcements needed, State content correctly Electronic Signature(s) Signed: 02/08/2021 6:17:04 PM By: Baruch Gouty RN, BSN Entered By: Baruch Gouty on 02/08/2021 16:57:57 -------------------------------------------------------------------------------- Wound Assessment Details Patient Name: Date of Service: FO RD, RA V EN Patrick. 02/08/2021 3:15 PM Medical Record Number: [id number] Patient Account Number: [id number] Date of Birth/Sex: Treating RN: [birth year] (25 y.o. Ellen Patrick Primary Care Cosandra Plouffe: PA [place]ig [place], [place] Other Clinician: Referring Nazair Fortenberry: Treating Renny Remer/Extender: Solon Augusta in Treatment: 1 Wound Status Wound Number: 1 Primary Etiology: T be determined o Wound Location: Right, Anterior Lower Leg Wound Status: Open Wounding Event: Gradually Appeared Comorbid History: Crohns Date Acquired: 01/04/2021 Weeks Of Treatment: 1 Clustered Wound: No Photos Wound Measurements Length: (cm) 1.5 Width: (cm) 1 Depth: (cm) 0.2 Area: (cm) 1.178 Volume: (cm) 0.236 % Reduction in Area: -36.3% % Reduction in Volume: -36.4% Epithelialization: None Tunneling: No Undermining: No Wound Description Classification: Full Thickness With Exposed Support Structures Wound Margin: Distinct, outline attached Exudate Amount: Medium Exudate Type: Serosanguineous Exudate Color: red, brown Foul Odor After Cleansing: No Slough/Fibrino Yes Wound Bed Granulation Amount: Medium (34-66%) Exposed Structure Granulation Quality: Red, Pink Fascia Exposed: No Necrotic Amount: Medium (34-66%) Fat Layer (Subcutaneous Tissue) Exposed: Yes Necrotic Quality: Adherent Slough Tendon Exposed:  No Muscle Exposed: No Joint Exposed: No Bone Exposed: No Treatment Notes Wound #1 (Lower Leg) Wound Laterality: Right, Anterior Cleanser Soap and Water Discharge Instruction: May shower and wash wound with dial antibacterial soap and water prior to dressing change. Wound Cleanser Discharge Instruction: Cleanse the wound with wound cleanser prior to applying a clean dressing using gauze sponges, not tissue or cotton balls. Peri-Wound Care Topical Primary Dressing KerraCel Ag Gelling Fiber Dressing, 4x5 in (silver alginate) Discharge Instruction: Apply silver alginate to wound bed as instructed Secondary Dressing Woven Gauze Sponge, Non-Sterile 4x4 in Discharge Instruction: Apply over primary dressing as directed. Bordered Gauze, 4x4 in Discharge Instruction: Apply over primary dressing as directed. Secured With Compression Wrap Compression Stockings Environmental education officer) Signed: 02/08/2021 6:17:04 PM By: Baruch Gouty RN, BSN Entered By: Baruch Gouty on 02/08/2021 16:43:06 -------------------------------------------------------------------------------- Wound Assessment Details Patient Name: Date of Service: FO RD, RA V EN Patrick. 02/08/2021 3:15 PM Medical Record Number: [id number] Patient Account Number: [id number] Date of Birth/Sex: Treating RN: Patrick[birth year] (25 y.o. Ellen Patrick Primary Care Attie Nawabi: PA [place]ig [place], [place] Other Clinician: Referring Ardis Lawley: Treating Koreena Joost/Extender: Solon Augusta in Treatment: 1 Wound Status Wound Number: 2 Primary Etiology: Infection - not elsewhere classified Wound Location: Left, Medial Lower Leg Wound Status: Open Wounding Event: Gradually Appeared Comorbid History: Crohns Date Acquired: 01/04/2021 Weeks Of  Treatment: 1 Clustered Wound: No Photos Wound Measurements Length: (cm) 0.7 Width: (cm) 0.8 Depth: (cm) 0.2 Area: (cm) 0.44 Volume: (cm) 0.088 % Reduction in Area: 77.6% % Reduction in  Volume: 55.1% Epithelialization: None Tunneling: No Undermining: No Wound Description Classification: Full Thickness With Exposed Support Structures Wound Margin: Distinct, outline attached Exudate Amount: Medium Exudate Type: Serosanguineous Exudate Color: red, brown Foul Odor After Cleansing: No Slough/Fibrino Yes Wound Bed Granulation Amount: Large (67-100%) Exposed Structure Granulation Quality: Red Fascia Exposed: No Necrotic Amount: Small (1-33%) Fat Layer (Subcutaneous Tissue) Exposed: Yes Necrotic Quality: Adherent Slough Tendon Exposed: No Muscle Exposed: No Joint Exposed: No Bone Exposed: No Treatment Notes Wound #2 (Lower Leg) Wound Laterality: Left, Medial Cleanser Soap and Water Discharge Instruction: May shower and wash wound with dial antibacterial soap and water prior to dressing change. Wound Cleanser Discharge Instruction: Cleanse the wound with wound cleanser prior to applying a clean dressing using gauze sponges, not tissue or cotton balls. Peri-Wound Care Topical Primary Dressing KerraCel Ag Gelling Fiber Dressing, 4x5 in (silver alginate) Discharge Instruction: Apply silver alginate to wound bed as instructed Secondary Dressing Woven Gauze Sponge, Non-Sterile 4x4 in Discharge Instruction: Apply over primary dressing as directed. Bordered Gauze, 4x4 in Discharge Instruction: Apply over primary dressing as directed. Secured With Compression Wrap Compression Stockings Environmental education officer) Signed: 02/08/2021 6:17:04 PM By: Baruch Gouty RN, BSN Entered By: Baruch Gouty on 02/08/2021 16:43:53 -------------------------------------------------------------------------------- Wound Assessment Details Patient Name: Date of Service: FO RD, RA V EN Patrick. 02/08/2021 3:15 PM Medical Record Number: [id number] Patient Account Number: [id number] Date of Birth/Sex: Treating RN: [birth year] (25 y.o. Ellen Patrick Primary Care Nikolaos Maddocks: PA [place]ig [place],  [place] Other Clinician: Referring Vincenzo Stave: Treating Kataleyah Carducci/Extender: Solon Augusta in Treatment: 1 Wound Status Wound Number: 3 Primary Etiology: T be determined o Wound Location: Left, Posterior Lower Leg Wound Status: Open Wounding Event: Gradually Appeared Comorbid History: Crohns Date Acquired: 01/04/2021 Weeks Of Treatment: 1 Clustered Wound: Yes Photos Wound Measurements Length: (cm) 2.3 Width: (cm) 3.4 Depth: (cm) 0.2 Clustered Quantity: 4 Area: (cm) 6.142 Volume: (cm) 1.228 % Reduction in Area: -360.1% % Reduction in Volume: -359.9% Epithelialization: None Tunneling: No Undermining: No Wound Description Classification: Full Thickness With Exposed Support Structures Wound Margin: Distinct, outline attached Exudate Amount: Medium Exudate Type: Serosanguineous Exudate Color: red, brown Foul Odor After Cleansing: No Slough/Fibrino Yes Wound Bed Granulation Amount: Medium (34-66%) Exposed Structure Granulation Quality: Red, Pink Fascia Exposed: No Necrotic Amount: Medium (34-66%) Fat Layer (Subcutaneous Tissue) Exposed: Yes Necrotic Quality: Adherent Slough Tendon Exposed: No Muscle Exposed: No Joint Exposed: No Bone Exposed: No Treatment Notes Wound #3 (Lower Leg) Wound Laterality: Left, Posterior Cleanser Soap and Water Discharge Instruction: May shower and wash wound with dial antibacterial soap and water prior to dressing change. Wound Cleanser Discharge Instruction: Cleanse the wound with wound cleanser prior to applying a clean dressing using gauze sponges, not tissue or cotton balls. Peri-Wound Care Topical Primary Dressing KerraCel Ag Gelling Fiber Dressing, 4x5 in (silver alginate) Discharge Instruction: Apply silver alginate to wound bed as instructed Secondary Dressing Woven Gauze Sponge, Non-Sterile 4x4 in Discharge Instruction: Apply over primary dressing as directed. Bordered Gauze, 4x4 in Discharge Instruction:  Apply over primary dressing as directed. Secured With Compression Wrap Compression Stockings Environmental education officer) Signed: 02/08/2021 6:17:04 PM By: Baruch Gouty RN, BSN Entered By: Baruch Gouty on 02/08/2021 16:44:46 -------------------------------------------------------------------------------- Wound Assessment Details Patient Name: Date of Service: FO RD, RA V EN Patrick. 02/08/2021 3:15 PM Medical Record  Number: [id number] Patient Account Number: [id number] Date of Birth/Sex: Treating RN: Patrick[birth year] (25 y.o. Ellen Patrick Primary Care Shaylon Aden: PA [place]ig [place], [place] Other Clinician: Referring Dorance Spink: Treating Dayquan Buys/Extender: Solon Augusta in Treatment: 1 Wound Status Wound Number: 4 Primary Etiology: T be determined o Wound Location: Left T Second oe Wound Status: Open Wounding Event: Gradually Appeared Comorbid History: Crohns Date Acquired: 01/04/2021 Weeks Of Treatment: 1 Clustered Wound: Yes Photos Wound Measurements Length: (cm) 1.4 Width: (cm) 0.7 Depth: (cm) 0.1 Clustered Quantity: 2 Area: (cm) 0.77 Volume: (cm) 0.07 Wound Description Classification: Full Thickness With Exposed Support Struct Wound Margin: Distinct, outline attached Exudate Amount: Medium Exudate Type: Serosanguineous Exudate Color: red, brown Foul Odor After Cleansing: Slough/Fibrino % Reduction in Area: -292.9% % Reduction in Volume: -97.4% Epithelialization: None Tunneling: No Undermining: No 7 ures No Yes Wound Bed Granulation Amount: Medium (34-66%) Exposed Structure Granulation Quality: Red, Pink Fascia Exposed: No Necrotic Amount: Medium (34-66%) Fat Layer (Subcutaneous Tissue) Exposed: Yes Necrotic Quality: Adherent Slough Tendon Exposed: No Muscle Exposed: No Joint Exposed: No Bone Exposed: No Treatment Notes Wound #4 (Toe Second) Wound Laterality: Left Cleanser Soap and Water Discharge Instruction: May shower and wash wound  with dial antibacterial soap and water prior to dressing change. Wound Cleanser Discharge Instruction: Cleanse the wound with wound cleanser prior to applying a clean dressing using gauze sponges, not tissue or cotton balls. Peri-Wound Care Topical Primary Dressing KerraCel Ag Gelling Fiber Dressing, 4x5 in (silver alginate) Discharge Instruction: Apply silver alginate to wound bed as instructed Secondary Dressing Woven Gauze Sponge, Non-Sterile 4x4 in Discharge Instruction: Apply over primary dressing as directed. Bordered Gauze, 4x4 in Discharge Instruction: Apply over primary dressing as directed. Secured With Compression Wrap Compression Stockings Environmental education officer) Signed: 02/08/2021 6:17:04 PM By: Baruch Gouty RN, BSN Entered By: Baruch Gouty on 02/08/2021 16:45:54 -------------------------------------------------------------------------------- Vitals Details Patient Name: Date of Service: FO RD, RA V EN Patrick. 02/08/2021 3:15 PM Medical Record Number: [id number] Patient Account Number: [id number] Date of Birth/Sex: Treating RN: [birth year] (25 y.o. Ellen Patrick Primary Care Dietrick Barris: PA Haig Prophet, [place] Other Clinician: Referring Roselia Snipe: Treating Kaius Daino/Extender: [place] in Treatment: 1 Vital Signs Time Taken: 16:25 Temperature (F): 98.8 Height (in): 66 Pulse (bpm): 77 Source: Stated Respiratory Rate (breaths/min): 18 Weight (lbs): 230 Blood Pressure (mmHg): 122/78 Source: Stated Reference Range: 80 - 120 mg / dl Body Mass Index (BMI): 37.1 Electronic Signature(s) Signed: 02/08/2021 6:17:04 PM By: Baruch Gouty RN, BSN Entered By: Baruch Gouty on 02/08/2021 16:25:46

## 2021-02-08 NOTE — Progress Notes (Signed)
Ellen Patrick, Ellen Patrick ([id number]) Visit Report for 02/08/2021 HPI Details Patient Name: Date of Service: FO RD, Fayrene Helper EN L. 02/08/2021 3:15 PM Medical Record Number: [id number] Patient Account Number: [id number] Date of Birth/Sex: Treating RN: [birth year] (25 y.o. [name] Primary Care Provider: PA Zenovia Jordan, [place] Other Clinician: Referring Provider: Treating Provider/Extender: Sheryle Spray in Treatment: 1 History of Present Illness HPI Description: ADMISSION 02/01/21; This is a 25 year old woman who had Crohn's disease or perhaps ulcerative colitis diagnosed by GI at [facility] some years ago. This was apparently confirmed at [facility] although the patient thinks they told her it was ulcerative colitis. The most recent notes I see are from 9 at 2020 at which times that they are quoting Crohn's disease. She was supposed to be on mesalamine but it was not really helping she stopped that and really has not had too much problems since. She denies diarrhea, abdominal pain bleeding or weight loss. She tells me that some years ago she started noticing subcutaneous nodules mostly on the inner part of both calves. These would come up and go down not usually open and not usually causing any pain. While vacationing in the [place] in August she developed open areas on both legs and the left second toe. She saw Dr. Allena Katz about the toe at which time he noted multiple pustules and put her on doxycycline and Cipro which she completed. Currently she has an open punched-out area on the right anterior tibia, left medial calf and a open area on the base of her left second toe. She shows me a nodule on the right lateral medial calf which is somewhat firm but nontender. On the left medial calf she has a small discolored area with a pustule. The open areas she says often started like this. She has not noticed any other skin issues The patient was in the ER on 8/17 and they referred her here wondering  about pyoderma gangrenosum. She also saw Dr. Allena Katz who noted multiple pustules in the left foot.. As noted put her on antibi 02/07/2021; patient is here for review of her bilateral lower legs and left foot wounds and skin conditions. She has punched-out areas on the right anterior tibial area collection on the right medial ankle and on the left foot between the first and second toes. In addition to this she has several small pustules 1 of which I cultured last week which showed rare MSSA. She also has some nodules most evident on the left foot. On the right leg laterally she has raised firm areas which look a lot like erythema nodosum. They are not particularly tender this would be in relation to her inflammatory bowel disease which she has been previously diagnosed with Electronic Signature(s) Signed: 02/08/2021 5:26:40 PM By: Baltazar Najjar MD Entered By: Baltazar Najjar on 02/08/2021 17:26:40 -------------------------------------------------------------------------------- Physical Exam Details Patient Name: Date of Service: FO RD, RA V EN L. 02/08/2021 3:15 PM Medical Record Number: [id number] Patient Account Number: [id number] Date of Birth/Sex: Treating RN: 26-Jul-1995 (25 y.o. [name] Primary Care Provider: PA Zenovia Jordan, [place] Other Clinician: Referring Provider: Treating Provider/Extender: Sheryle Spray in Treatment: 1 Constitutional Sitting or standing Blood Pressure is within target range for patient.. Pulse regular and within target range for patient.Marland Kitchen Respirations regular, non-labored and within target range.. Temperature is normal and within the target range for the patient.Marland Kitchen Appears in no distress. Cardiovascular Pedal pulses Palpable. Minimal edema. Some of the skin changes on the left  medial lower leg look like stasis dermatitis. Notes Wound exam; an interesting mixture of problems. On the right anterior tibia and the left medial lower leg she has a  collection of small punched-out ulcerations. These do not look infected. On the right medial lower leg there are 2 areas raised large inflamed areas but nontender this looks like erythema nodosum. On her left foot there are open areas between her first and second toes which are painful nodules over the top of her foot which are noninfectious She has small pustules visible on her lower legs especially left. 1 of these I cultured last week showed MSSA Electronic Signature(s) Signed: 02/08/2021 5:31:42 PM By: Baltazar Najjar MD Previous Signature: 02/08/2021 5:31:06 PM Version By: Baltazar Najjar MD Entered By: Baltazar Najjar on 02/08/2021 17:31:42 -------------------------------------------------------------------------------- Physician Orders Details Patient Name: Date of Service: FO RD, RA V EN L. 02/08/2021 3:15 PM Medical Record Number: [id number] Patient Account Number: [id number] Date of Birth/Sex: Treating RN: 1995/09/09 (25 y.o. Tommye Standard Primary Care Provider: PA Zenovia Jordan, [place] Other Clinician: Referring Provider: Treating Provider/Extender: Sheryle Spray in Treatment: 1 Verbal / Phone Orders: No Diagnosis Coding ICD-10 Coding Code Description L97.818 Non-pressure chronic ulcer of other part of right lower leg with other specified severity L97.928 Non-pressure chronic ulcer of unspecified part of left lower leg with other specified severity L97.528 Non-pressure chronic ulcer of other part of left foot with other specified severity K50.918 Crohn's disease, unspecified, with other complication Follow-up Appointments ppointment in 1 week. - Dr. Leanord Hawking Return A Other: - pick up antibiotic from pharmacy and start taking as prescribed Bathing/ Shower/ Hygiene May shower and wash wound with soap and water. - shower with hibiclens daily for 7 days, then weekly for 4 more weeks Edema Control - Lymphedema / SCD / Other Elevate legs to the level of the heart or  above for 30 minutes daily and/or when sitting, a frequency of: Avoid standing for long periods of time. Wound Treatment Wound #1 - Lower Leg Wound Laterality: Right, Anterior Cleanser: Soap and Water 1 x Per Day/15 Days Discharge Instructions: May shower and wash wound with dial antibacterial soap and water prior to dressing change. Cleanser: Wound Cleanser (Generic) 1 x Per Day/15 Days Discharge Instructions: Cleanse the wound with wound cleanser prior to applying a clean dressing using gauze sponges, not tissue or cotton balls. Prim Dressing: KerraCel Ag Gelling Fiber Dressing, 4x5 in (silver alginate) (Generic) 1 x Per Day/15 Days ary Discharge Instructions: Apply silver alginate to wound bed as instructed Secondary Dressing: Woven Gauze Sponge, Non-Sterile 4x4 in (Generic) 1 x Per Day/15 Days Discharge Instructions: Apply over primary dressing as directed. Secondary Dressing: Bordered Gauze, 4x4 in (Generic) 1 x Per Day/15 Days Discharge Instructions: Apply over primary dressing as directed. Wound #2 - Lower Leg Wound Laterality: Left, Medial Cleanser: Soap and Water Every Other Day/15 Days Discharge Instructions: May shower and wash wound with dial antibacterial soap and water prior to dressing change. Cleanser: Wound Cleanser (Generic) Every Other Day/15 Days Discharge Instructions: Cleanse the wound with wound cleanser prior to applying a clean dressing using gauze sponges, not tissue or cotton balls. Prim Dressing: KerraCel Ag Gelling Fiber Dressing, 4x5 in (silver alginate) (Generic) Every Other Day/15 Days ary Discharge Instructions: Apply silver alginate to wound bed as instructed Secondary Dressing: Woven Gauze Sponge, Non-Sterile 4x4 in (Generic) Every Other Day/15 Days Discharge Instructions: Apply over primary dressing as directed. Secondary Dressing: Bordered Gauze, 4x4 in (Generic) Every Other Day/15 Days  Discharge Instructions: Apply over primary dressing as  directed. Wound #3 - Lower Leg Wound Laterality: Left, Posterior Cleanser: Soap and Water Every Other Day/15 Days Discharge Instructions: May shower and wash wound with dial antibacterial soap and water prior to dressing change. Cleanser: Wound Cleanser (Generic) Every Other Day/15 Days Discharge Instructions: Cleanse the wound with wound cleanser prior to applying a clean dressing using gauze sponges, not tissue or cotton balls. Prim Dressing: KerraCel Ag Gelling Fiber Dressing, 4x5 in (silver alginate) (Generic) Every Other Day/15 Days ary Discharge Instructions: Apply silver alginate to wound bed as instructed Secondary Dressing: Woven Gauze Sponge, Non-Sterile 4x4 in (Generic) Every Other Day/15 Days Discharge Instructions: Apply over primary dressing as directed. Secondary Dressing: Bordered Gauze, 4x4 in (Generic) Every Other Day/15 Days Discharge Instructions: Apply over primary dressing as directed. Wound #4 - T Second oe Wound Laterality: Left Cleanser: Soap and Water Every Other Day/15 Days Discharge Instructions: May shower and wash wound with dial antibacterial soap and water prior to dressing change. Cleanser: Wound Cleanser (Generic) Every Other Day/15 Days Discharge Instructions: Cleanse the wound with wound cleanser prior to applying a clean dressing using gauze sponges, not tissue or cotton balls. Prim Dressing: KerraCel Ag Gelling Fiber Dressing, 4x5 in (silver alginate) (Generic) Every Other Day/15 Days ary Discharge Instructions: Apply silver alginate to wound bed as instructed Secondary Dressing: Woven Gauze Sponge, Non-Sterile 4x4 in (Generic) Every Other Day/15 Days Discharge Instructions: Apply over primary dressing as directed. Secondary Dressing: Bordered Gauze, 4x4 in (Generic) Every Other Day/15 Days Discharge Instructions: Apply over primary dressing as directed. Consults Dermatology - [facility] dermatology for evaluation and treatment of atypical skin  lesions Patient Medications llergies: No Known Allergies A Notifications Medication Indication Start End MSSA 02/08/2021 cephalexin DOSE oral 500 mg capsule - 1 capsule oral q6h for 10 days Electronic Signature(s) Signed: 02/08/2021 5:35:25 PM By: Baltazar Najjar MD Entered By: Baltazar Najjar on 02/08/2021 17:35:24 Prescription 02/08/2021 -------------------------------------------------------------------------------- Dwyane Luo MD Patient Name: Provider: February 14, 1996 [id number] Date of Birth: NPI#Jerral Ralph Sex: DEA #: [phone] Phone #: License #: Eligha Bridegroom [facility] Patient Address: [place] [facility] [address] [place], [place] [address] [address] [place], [place] [address] [phone] Allergies No Known Allergies Provider's Orders Dermatology - [facility] dermatology for evaluation and treatment of atypical skin lesions Hand Signature: Date(s): Electronic Signature(s) Signed: 02/08/2021 5:45:05 PM By: Baltazar Najjar MD Entered By: Baltazar Najjar on 02/08/2021 17:35:25 -------------------------------------------------------------------------------- Problem List Details Patient Name: Date of Service: FO RD, RA V EN L. 02/08/2021 3:15 PM Medical Record Number: [id number] Patient Account Number: [id number] Date of Birth/Sex: Treating RN: 1995-11-23 (25 y.o. Tommye Standard Primary Care Provider: PA Zenovia Jordan, [place] Other Clinician: Referring Provider: Treating Provider/Extender: Sheryle Spray in Treatment: 1 Active Problems ICD-10 Encounter Code Description Active Date MDM Diagnosis L97.818 Non-pressure chronic ulcer of other part of right lower leg with other specified 02/01/2021 No Yes severity L97.928 Non-pressure chronic ulcer of unspecified part of left lower leg with other 02/01/2021 No Yes specified severity L97.528 Non-pressure chronic ulcer of other part of left foot  with other specified 02/01/2021 No Yes severity K50.918 Crohn's disease, unspecified, with other complication 02/01/2021 No Yes L52 Erythema nodosum 02/08/2021 No Yes A49.01 Methicillin susceptible Staphylococcus aureus infection, unspecified site 02/08/2021 No Yes Inactive Problems Resolved Problems Electronic Signature(s) Signed: 02/08/2021 5:24:43 PM By: Baltazar Najjar MD Entered By: Baltazar Najjar on 02/08/2021 17:24:43 -------------------------------------------------------------------------------- Progress Note Details Patient Name: Date  of Service: FO RD, RA V EN L. 02/08/2021 3:15 PM Medical Record Number: [id number] Patient Account Number: [id number] Date of Birth/Sex: Treating RN: 06-Feb-1996 (25 y.o. [name] Primary Care Provider: PA Zenovia Jordan, [place] Other Clinician: Referring Provider: Treating Provider/Extender: Sheryle Spray in Treatment: 1 Subjective History of Present Illness (HPI) ADMISSION 02/01/21; This is a 25 year old woman who had Crohn's disease or perhaps ulcerative colitis diagnosed by GI at [facility] some years ago. This was apparently confirmed at [facility] although the patient thinks they told her it was ulcerative colitis. The most recent notes I see are from 9 at 2020 at which times that they are quoting Crohn's disease. She was supposed to be on mesalamine but it was not really helping she stopped that and really has not had too much problems since. She denies diarrhea, abdominal pain bleeding or weight loss. She tells me that some years ago she started noticing subcutaneous nodules mostly on the inner part of both calves. These would come up and go down not usually open and not usually causing any pain. While vacationing in the [place] in August she developed open areas on both legs and the left second toe. She saw Dr. Allena Katz about the toe at which time he noted multiple pustules and put her on doxycycline and Cipro which she completed.  Currently she has an open punched-out area on the right anterior tibia, left medial calf and a open area on the base of her left second toe. She shows me a nodule on the right lateral medial calf which is somewhat firm but nontender. On the left medial calf she has a small discolored area with a pustule. The open areas she says often started like this. She has not noticed any other skin issues The patient was in the ER on 8/17 and they referred her here wondering about pyoderma gangrenosum. She also saw Dr. Allena Katz who noted multiple pustules in the left foot.. As noted put her on antibi 02/07/2021; patient is here for review of her bilateral lower legs and left foot wounds and skin conditions. She has punched-out areas on the right anterior tibial area collection on the right medial ankle and on the left foot between the first and second toes. In addition to this she has several small pustules 1 of which I cultured last week which showed rare MSSA. She also has some nodules most evident on the left foot. On the right leg laterally she has raised firm areas which look a lot like erythema nodosum. They are not particularly tender this would be in relation to her inflammatory bowel disease which she has been previously diagnosed with Objective Constitutional Sitting or standing Blood Pressure is within target range for patient.. Pulse regular and within target range for patient.Marland Kitchen Respirations regular, non-labored and within target range.. Temperature is normal and within the target range for the patient.Marland Kitchen Appears in no distress. Vitals Time Taken: 4:25 PM, Height: 66 in, Source: Stated, Weight: 230 lbs, Source: Stated, BMI: 37.1, Temperature: 98.8 F, Pulse: 77 bpm, Respiratory Rate: 18 breaths/min, Blood Pressure: 122/78 mmHg. Cardiovascular Pedal pulses Palpable. Minimal edema. Some of the skin changes on the left medial lower leg look like stasis dermatitis. General Notes: Wound exam; an  interesting mixture of problems. On the right anterior tibia and the left medial lower leg she has a collection of small punched- out ulcerations. These do not look infected. oo On the right medial lower leg there are 2 areas raised large  inflamed areas but nontender this looks like erythema nodosum. oo On her left foot there are open areas between her first and second toes which are painful nodules over the top of her foot which are noninfectious oo She has small pustules visible on her lower legs especially left. 1 of these I cultured last week showed MSSA Integumentary (Hair, Skin) Wound #1 status is Open. Original cause of wound was Gradually Appeared. The date acquired was: 01/04/2021. The wound has been in treatment 1 weeks. The wound is located on the Right,Anterior Lower Leg. The wound measures 1.5cm length x 1cm width x 0.2cm depth; 1.178cm^2 area and 0.236cm^3 volume. There is Fat Layer (Subcutaneous Tissue) exposed. There is no tunneling or undermining noted. There is a medium amount of serosanguineous drainage noted. The wound margin is distinct with the outline attached to the wound base. There is medium (34-66%) red, pink granulation within the wound bed. There is a medium (34-66%) amount of necrotic tissue within the wound bed including Adherent Slough. Wound #2 status is Open. Original cause of wound was Gradually Appeared. The date acquired was: 01/04/2021. The wound has been in treatment 1 weeks. The wound is located on the Left,Medial Lower Leg. The wound measures 0.7cm length x 0.8cm width x 0.2cm depth; 0.44cm^2 area and 0.088cm^3 volume. There is Fat Layer (Subcutaneous Tissue) exposed. There is no tunneling or undermining noted. There is a medium amount of serosanguineous drainage noted. The wound margin is distinct with the outline attached to the wound base. There is large (67-100%) red granulation within the wound bed. There is a small (1-33%) amount of necrotic tissue within  the wound bed including Adherent Slough. Wound #3 status is Open. Original cause of wound was Gradually Appeared. The date acquired was: 01/04/2021. The wound has been in treatment 1 weeks. The wound is located on the Left,Posterior Lower Leg. The wound measures 2.3cm length x 3.4cm width x 0.2cm depth; 6.142cm^2 area and 1.228cm^3 volume. There is Fat Layer (Subcutaneous Tissue) exposed. There is no tunneling or undermining noted. There is a medium amount of serosanguineous drainage noted. The wound margin is distinct with the outline attached to the wound base. There is medium (34-66%) red, pink granulation within the wound bed. There is a medium (34-66%) amount of necrotic tissue within the wound bed including Adherent Slough. Wound #4 status is Open. Original cause of wound was Gradually Appeared. The date acquired was: 01/04/2021. The wound has been in treatment 1 weeks. The wound is located on the Left T Second. The wound measures 1.4cm length x 0.7cm width x 0.1cm depth; 0.77cm^2 area and 0.077cm^3 volume. There is Fat oe Layer (Subcutaneous Tissue) exposed. There is no tunneling or undermining noted. There is a medium amount of serosanguineous drainage noted. The wound margin is distinct with the outline attached to the wound base. There is medium (34-66%) red, pink granulation within the wound bed. There is a medium (34-66%) amount of necrotic tissue within the wound bed including Adherent Slough. 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 - Dr. Leanord Hawking Other: - pick up antibiotic from pharmacy and start taking as prescribed Bathing/ Shower/  Hygiene: May shower and wash wound with soap and water. - shower with hibiclens daily for 7 days, then weekly for 4 more weeks Edema Control - Lymphedema / SCD / Other: Elevate legs to the level of the heart or above for 30 minutes daily and/or when sitting, a frequency of: Avoid standing for long periods of time. Consults ordered were: Dermatology - [facility] dermatology for evaluation and treatment of atypical skin lesions WOUND #1: - Lower Leg Wound Laterality: Right, Anterior Cleanser: Soap and Water 1 x Per Day/15 Days Discharge Instructions: May shower and wash wound with dial antibacterial soap and water prior to dressing change. Cleanser: Wound Cleanser (Generic) 1 x Per Day/15 Days Discharge Instructions: Cleanse the wound with wound cleanser prior to applying a clean dressing using gauze sponges, not tissue or cotton balls. Prim Dressing: KerraCel Ag Gelling Fiber Dressing, 4x5 in (silver alginate) (Generic) 1 x Per Day/15 Days ary Discharge Instructions: Apply silver alginate to wound bed as instructed Secondary Dressing: Woven Gauze Sponge, Non-Sterile 4x4 in (Generic) 1 x Per Day/15 Days Discharge Instructions: Apply over primary dressing as directed. Secondary Dressing: Bordered Gauze, 4x4 in (Generic) 1 x Per Day/15 Days Discharge Instructions: Apply over primary dressing as directed. WOUND #2: - Lower Leg Wound Laterality: Left, Medial Cleanser: Soap and Water Every Other Day/15 Days Discharge Instructions: May shower and wash wound with dial antibacterial soap and water prior to dressing change. Cleanser: Wound Cleanser (Generic) Every Other Day/15 Days Discharge Instructions: Cleanse the wound with wound cleanser prior to applying a clean dressing using gauze sponges, not tissue or cotton balls. Prim Dressing: KerraCel Ag Gelling Fiber Dressing, 4x5 in (silver  alginate) (Generic) Every Other Day/15 Days ary Discharge Instructions: Apply silver alginate to wound bed as instructed Secondary Dressing: Woven Gauze Sponge, Non-Sterile 4x4 in (Generic) Every Other Day/15 Days Discharge Instructions: Apply over primary dressing as directed. Secondary Dressing: Bordered Gauze, 4x4 in (Generic) Every Other Day/15 Days Discharge Instructions: Apply over primary dressing as directed. WOUND #3: - Lower Leg Wound Laterality: Left, Posterior Cleanser: Soap and Water Every Other Day/15 Days Discharge Instructions: May shower and wash wound with dial antibacterial soap and water prior to dressing change. Cleanser: Wound Cleanser (Generic) Every Other Day/15 Days Discharge Instructions: Cleanse the wound with wound cleanser prior to applying a clean dressing using gauze sponges, not tissue or cotton balls. Prim Dressing: KerraCel Ag Gelling Fiber Dressing, 4x5 in (silver alginate) (Generic) Every Other Day/15 Days ary Discharge Instructions: Apply silver alginate to wound bed as instructed Secondary Dressing: Woven Gauze Sponge, Non-Sterile 4x4 in (Generic) Every Other Day/15 Days Discharge Instructions: Apply over primary dressing as directed. Secondary Dressing: Bordered Gauze, 4x4 in (Generic) Every Other Day/15 Days Discharge Instructions: Apply over primary dressing as directed. WOUND #4: - T Second Wound Laterality: Left oe Cleanser: Soap and Water Every Other Day/15 Days Discharge Instructions: May shower and wash wound with dial antibacterial soap and water prior to dressing change. Cleanser: Wound Cleanser (Generic) Every Other Day/15 Days Discharge Instructions: Cleanse the wound with wound cleanser prior to applying a clean dressing using gauze sponges, not tissue or cotton balls. Prim Dressing: KerraCel Ag Gelling Fiber Dressing, 4x5 in (silver alginate) (Generic) Every Other Day/15 Days ary Discharge Instructions: Apply silver alginate to wound bed  as instructed Secondary Dressing: Woven Gauze Sponge, Non-Sterile 4x4 in (Generic) Every Other Day/15 Days Discharge Instructions: Apply over primary dressing as directed. Secondary Dressing: Bordered Gauze, 4x4 in (Generic) Every Other Day/15 Days Discharge Instructions: Apply  over primary dressing as directed. 1. I am not sure what a unifying diagnosis here is. Some of what she has looks like erythema nodosum especially on the left medial leg however this does not usually ulcerate and is not usually painful 2. She also has small pustules that seem to culture MSSA I think we should aggressively treat her for MSSA we are going to use Hibiclens showers and I am going to put cephalexin through for 10 days 3. The nodules on her feet I am of uncertain etiology. I am not sure what these are 4. I was asked to see her originally about pyoderma gangrenosum none of this really looks like this and I do not believe that this is the diagnosis. 5. I am going to try to get her into see dermatology. She apparently tried on her own and has appointment in April Electronic Signature(s) Signed: 02/08/2021 5:35:38 PM By: Baltazar Najjar MD Previous Signature: 02/08/2021 5:33:27 PM Version By: Baltazar Najjar MD Entered By: Baltazar Najjar on 02/08/2021 17:35:38 -------------------------------------------------------------------------------- SuperBill Details Patient Name: Date of Service: FO RD, RA V EN L. 02/08/2021 Medical Record Number: [id number] Patient Account Number: [id number] Date of Birth/Sex: Treating RN: 23-Sep-1995 (25 y.o. 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 K50.918 Crohn's disease, unspecified, with other complication L52 Erythema nodosum A49.01 Methicillin susceptible Staphylococcus aureus infection, unspecified site Facility Procedures CPT4 Code: [id number] Description: [phone] - WOUND CARE VISIT-LEV 5 EST PT Modifier: Quantity: 1 Physician Procedures : CPT4 Code Description Modifier [id number] - WC PHYS LEVEL 4 - EST PT ICD-10 Diagnosis Description L97.818 Non-pressure chronic ulcer of other part of right lower leg with other specified severity L97.928 Non-pressure chronic ulcer of unspecified  part of left lower leg with other specified severity L97.528 Non-pressure chronic ulcer of other part of left foot with other specified severity A49.01 Methicillin susceptible Staphylococcus aureus infection, unspecified site Quantity: 1 Electronic Signature(s) Signed: 02/08/2021 5:33:57 PM By: Baltazar Najjar MD Entered By: Baltazar Najjar on 02/08/2021 17:33:57

## 2021-02-22 ENCOUNTER — Encounter (HOSPITAL_BASED_OUTPATIENT_CLINIC_OR_DEPARTMENT_OTHER): Payer: Commercial Managed Care - PPO | Admitting: Internal Medicine

## 2021-03-01 ENCOUNTER — Other Ambulatory Visit: Payer: Self-pay

## 2021-03-01 ENCOUNTER — Encounter (HOSPITAL_BASED_OUTPATIENT_CLINIC_OR_DEPARTMENT_OTHER): Payer: Commercial Managed Care - PPO | Admitting: Internal Medicine

## 2021-03-01 DIAGNOSIS — L97818 Non-pressure chronic ulcer of other part of right lower leg with other specified severity: Secondary | ICD-10-CM | POA: Diagnosis not present

## 2021-03-06 NOTE — Progress Notes (Signed)
Ellen Patrick, Ellen Patrick ([id number]) Visit Report for 03/01/2021 Arrival Information Details Patient Name: Date of Service: Ellen Patrick EN L. 03/01/2021 3:15 PM Medical Record Number: [id number] Patient Account Number: [id number] Date of Birth/Sex: Treating RN: Dec 29, 1995 (25 y.o. Ellen Patrick, Meta.Reding Primary Care Mando Blatz: PA Haig Prophet, [place] Other Clinician: Referring Zoriana Oats: Treating Lilyonna Steidle/Extender: Solon Augusta in Treatment: 4 Visit Information History Since Last Visit Added or deleted any medications: No Patient Arrived: Ambulatory Any new allergies or adverse reactions: No Arrival Time: 15:25 Had a fall or experienced change in No Accompanied By: alone activities of daily living that may affect Transfer Assistance: None risk of falls: Patient Requires Transmission-Based Precautions: No Signs or symptoms of abuse/neglect since last visito No Patient Has Alerts: No Hospitalized since last visit: No Implantable device outside of the clinic excluding No cellular tissue based products placed in the center since last visit: Pain Present Now: No Electronic Signature(s) Signed: 03/01/2021 5:01:05 PM By: Dellie Catholic RN Entered By: Dellie Catholic on 03/01/2021 15:29:42 -------------------------------------------------------------------------------- Clinic Level of Care Assessment Details Patient Name: Date of Service: Ellen Patrick, Ellen V EN L. 03/01/2021 3:15 PM Medical Record Number: [id number] Patient Account Number: [id number] Date of Birth/Sex: Treating RN: [birth year] (25 y.o. Ellen Patrick Primary Care Ayannah Faddis: PA Haig Prophet, [place] Other Clinician: Referring Makela Niehoff: Treating Jaydee Conran/Extender: Solon Augusta in Treatment: 4 Clinic Level of Care Assessment Items TOOL 4 Quantity Score X- 1 0 Use when only an EandM is performed on FOLLOW-UP visit ASSESSMENTS - Nursing Assessment / Reassessment X- 1 10 Reassessment of Co-morbidities (includes updates  in patient status) X- 1 5 Reassessment of Adherence to Treatment Plan ASSESSMENTS - Wound and Skin A ssessment / Reassessment _0  - 0 Simple Wound Assessment / Reassessment - one wound X- 5 5 Complex Wound Assessment / Reassessment - multiple wounds _1  - 0 Dermatologic / Skin Assessment (not related to wound area) ASSESSMENTS - Focused Assessment _2  - 0 Circumferential Edema Measurements - multi extremities _3  - 0 Nutritional Assessment / Counseling / Intervention X- 1 5 Lower Extremity Assessment (monofilament, tuning fork, pulses) _4  - 0 Peripheral Arterial Disease Assessment (using hand held doppler) ASSESSMENTS - Ostomy and/or Continence Assessment and Care _5  - 0 Incontinence Assessment and Management _6  - 0 Ostomy Care Assessment and Management (repouching, etc.) PROCESS - Coordination of Care X - Simple Patient / Family Education for ongoing care 1 15 _7  - 0 Complex (extensive) Patient / Family Education for ongoing care X- 1 10 Staff obtains Programmer, systems, Records, T Results / Process Orders est _8  - 0 Staff telephones HHA, Nursing Homes / Clarify orders / etc _9  - 0 Routine Transfer to another Facility (non-emergent condition) _10  - 0 Routine Hospital Admission (non-emergent condition) _11  - 0 New Admissions / Biomedical engineer / Ordering NPWT Apligraf, etc. , _12  - 0 Emergency Hospital Admission (emergent condition) X- 1 10 Simple Discharge Coordination _13  - 0 Complex (extensive) Discharge Coordination PROCESS - Special Needs _14  - 0 Pediatric / Minor Patient Management _15  - 0 Isolation Patient Management _16  - 0 Hearing / Language / Visual special needs _17  - 0 Assessment of Community assistance (transportation, D/C planning, etc.) _18  - 0 Additional assistance / Altered mentation _19  - 0 Support Surface(s) Assessment (bed, cushion, seat, etc.) INTERVENTIONS - Wound Cleansing / Measurement _20  - 0 Simple Wound Cleansing - one wound X- 5 5 Complex  Wound Cleansing - multiple wounds X- 1 5 Wound Imaging (photographs - any number of wounds) _21  -  0 Wound Tracing (instead of photographs) _0  - 0 Simple Wound Measurement - one wound X- 5 5 Complex Wound Measurement - multiple wounds INTERVENTIONS - Wound Dressings X - Small Wound Dressing one or multiple wounds 5 10 _1  - 0 Medium Wound Dressing one or multiple wounds _2  - 0 Large Wound Dressing one or multiple wounds X- 1 5 Application of Medications - topical <[id number]>_9<\/[id number]>_5  - 0 Application of Medications - injection INTERVENTIONS - Miscellaneous _4  - 0 External ear exam _5  - 0 Specimen Collection (cultures, biopsies, blood, body fluids, etc.) _6  - 0 Specimen(s) / Culture(s) sent or taken to Lab for analysis _7  - 0 Patient Transfer (multiple staff / Harrel Lemon Lift / Similar devices) _8  - 0 Simple Staple / Suture removal (25 or less) _9  - 0 Complex Staple / Suture removal (26 or more) _10  - 0 Hypo / Hyperglycemic Management (close monitor of Blood Glucose) _11  - 0 Ankle / Brachial Index (ABI) - do not check if billed separately X- 1 5 Vital Signs Has the patient been seen at the hospital within the last three years: Yes Total Score: 195 Level Of Care: New/Established - Level 5 Electronic Signature(s) Signed: 03/01/2021 5:45:17 PM By: Levan Hurst RN, BSN Entered By: Levan Hurst on 03/01/2021 17:27:43 -------------------------------------------------------------------------------- Lower Extremity Assessment Details Patient Name: Date of Service: Ellen Patrick, Ellen V EN L. 03/01/2021 3:15 PM Medical Record Number: [id number] Patient Account Number: [id number] Date of Birth/Sex: Treating RN: 04/07/1996 (25 y.o. Debby Bud Primary Care Sharonica Kraszewski: PA TIENT, [place] Other Clinician: Referring Johnchristopher Sarvis: Treating Dezi Brauner/Extender: Solon Augusta in Treatment: 4 Edema Assessment Assessed: [Left: No] [Right: No] Edema: [Left: Yes] [Right: Yes] Calf Left: Right: Point of  Measurement: 31 cm From Medial Instep 41.8 cm 42.3 cm Ankle Left: Right: Point of Measurement: 10 cm From Medial Instep 24.3 cm 23.3 cm Vascular Assessment Pulses: Dorsalis Pedis Palpable: [Left:Yes] [Right:Yes] Posterior Tibial Palpable: [Left:Yes] [Right:Yes] Electronic Signature(s) Signed: 03/01/2021 5:01:05 PM By: Dellie Catholic RN Signed: 03/01/2021 5:43:20 PM By: Deon Pilling RN, BSN Entered By: Dellie Catholic on 03/01/2021 15:53:17 -------------------------------------------------------------------------------- Multi Wound Chart Details Patient Name: Date of Service: Ellen Patrick, Ellen V EN L. 03/01/2021 3:15 PM Medical Record Number: [id number] Patient Account Number: [id number] Date of Birth/Sex: Treating RN: [birth year] (25 y.o. Ellen Patrick, Meta.Reding Primary Care Granvil Djordjevic: PA TIENT, [place] Other Clinician: Referring Aundreya Souffrant: Treating Nina Hoar/Extender: Solon Augusta in Treatment: 4 Vital Signs Height(in): 66 Pulse(bpm): 80 Weight(lbs): 230 Blood Pressure(mmHg): 127/75 Body Mass Index(BMI): 37 Temperature(F): 98.9 Respiratory Rate(breaths/min): 14 Photos: Right, Anterior Lower Leg Left, Medial Lower Leg Left, Posterior Lower Leg Wound Location: Gradually Appeared Gradually Appeared Gradually Appeared Wounding Event: Atypical Infection - not elsewhere classified Atypical Primary Etiology: Crohns Crohns Crohns Comorbid History: 01/04/2021 01/04/2021 01/04/2021 Date Acquired: _12 Weeks of Treatment: Open Open Open Wound Status: No No Yes Clustered Wound: N/A N/A 3 Clustered Quantity: 1x0.4x0.1 0.7x0.9x0.1 2.3x2.4x0.1 Measurements L x W x D (cm) 0.314 0.495 4.335 A (cm) : rea 0.031 0.049 0.434 Volume (cm) : 63.70% 74.80% -224.70% % Reduction in A rea: 82.10% 75.00% -62.50% % Reduction in Volume: Full Thickness With Exposed Support Full Thickness With Exposed Support Full Thickness With Exposed Support Classification: Structures Structures  Structures Medium Medium Medium Exudate A mount: Serosanguineous Serosanguineous Serosanguineous Exudate Type: red, brown red, brown red, brown Exudate Color: Distinct, outline attached Distinct, outline attached Distinct, outline attached Wound Margin: Large (67-100%) Small (1-33%) Medium (34-66%) Granulation Amount: Red, Pink Red Red, Pink Granulation  Quality: None Present (0%) Small (1-33%) Medium (34-66%) Necrotic Amount: N/A Eschar Adherent Slough Necrotic Tissue: Fat Layer (Subcutaneous Tissue): Yes Fat Layer (Subcutaneous Tissue): Yes Fat Layer (Subcutaneous Tissue): Yes Exposed Structures: Fascia: No Fascia: No Fascia: No Tendon: No Tendon: No Tendon: No Muscle: No Muscle: No Muscle: No Joint: No Joint: No Joint: No Bone: No Bone: No Bone: No Medium (34-66%) None None Epithelialization: Wound Number: 4 5 N/A Photos: N/A Left T Second oe Right, Medial Lower Leg N/A Wound Location: Gradually Appeared Bump N/A Wounding Event: Atypical Atypical N/A Primary Etiology: Crohns Crohns N/A Comorbid History: 01/04/2021 02/26/2021 N/A Date Acquired: 4 0 N/A Weeks of Treatment: Open Open N/A Wound Status: Yes Yes N/A Clustered Wound: 2 2 N/A Clustered Quantity: 3x2.5x0.2 1.4x0.8x0.4 N/A Measurements L x W x D (cm) 5.89 0.88 N/A A (cm) : rea 1.178 0.352 N/A Volume (cm) : -2905.10% 0.00% N/A % Reduction in A rea: -2920.50% 0.00% N/A % Reduction in Volume: Full Thickness With Exposed Support Full Thickness Without Exposed N/A Classification: Structures Support Structures Large Medium N/A Exudate Amount: Serosanguineous Serosanguineous N/A Exudate Type: red, brown red, brown N/A Exudate Color: Distinct, outline attached Flat and Intact N/A Wound Margin: Medium (34-66%) Medium (34-66%) N/A Granulation Amount: Red, Pink Pink N/A Granulation Quality: Medium (34-66%) Medium (34-66%) N/A Necrotic Amount: Adherent Slough Adherent Slough  N/A Necrotic Tissue: Fat Layer (Subcutaneous Tissue): Yes Fat Layer (Subcutaneous Tissue): Yes N/A Exposed Structures: Fascia: No Fascia: No Tendon: No Tendon: No Muscle: No Muscle: No Joint: No Joint: No Bone: No Bone: No None None N/A Epithelialization: Treatment Notes Electronic Signature(s) Signed: 03/01/2021 5:43:20 PM By: Deon Pilling RN, BSN Signed: 03/06/2021 4:29:54 PM By: Linton Ham MD Entered By: Linton Ham on 03/01/2021 16:30:59 -------------------------------------------------------------------------------- Multi-Disciplinary Care Plan Details Patient Name: Date of Service: Ellen Patrick, Ellen V EN L. 03/01/2021 3:15 PM Medical Record Number: [id number] Patient Account Number: [id number] Date of Birth/Sex: Treating RN: [birth year] (25 y.o. Ellen Patrick Primary Care Shanavia Makela: PA Haig Prophet, [place] Other Clinician: Referring Nandini Bogdanski: Treating Ollivander See/Extender: Solon Augusta in Treatment: [place] reviewed with physician Active Inactive Wound/Skin Impairment Nursing Diagnoses: Impaired tissue integrity Knowledge deficit related to ulceration/compromised skin integrity Goals: Patient will have a decrease in wound volume by X% from date: (specify in notes) Date Initiated: 02/01/2021 Date Inactivated: 02/08/2021 Target Resolution Date: 02/06/2021 Goal Status: Met Patient/caregiver will verbalize understanding of skin care regimen Date Initiated: 02/01/2021 Target Resolution Date: 03/30/2021 Goal Status: Active Interventions: Assess patient/caregiver ability to obtain necessary supplies Assess patient/caregiver ability to perform ulcer/skin care regimen upon admission and as needed Assess ulceration(s) every visit Notes: Electronic Signature(s) Signed: 03/01/2021 5:45:17 PM By: Levan Hurst RN, BSN Entered By: Levan Hurst on 03/01/2021  16:23:24 -------------------------------------------------------------------------------- Pain Assessment Details Patient Name: Date of Service: Ellen Patrick, Ellen V EN L. 03/01/2021 3:15 PM Medical Record Number: [id number] Patient Account Number: [id number] Date of Birth/Sex: Treating RN: 1995/12/30 (25 y.o. Ellen Patrick Primary Care Marlies Ligman: Other Clinician: PA TIENT, NO Referring Wren Pryce: Treating Laylynn Campanella/Extender: Solon Augusta in Treatment: 4 Active Problems Location of Pain Severity and Description of Pain Patient Has Paino Yes Site Locations Pain Location: Pain in Ulcers With Dressing Change: Yes Rate the pain. Current Pain Level: 10 Character of Pain Describe the Pain: Burning, Throbbing Pain Management and Medication Current Pain Management: Medication: Yes Cold Application: No Rest: No Massage: No Activity: No T.E.N.S.: No Heat Application: No Leg drop or elevation: No Is the Current Pain  Management Adequate: Adequate How does your wound impact your activities of daily livingo Sleep: No Bathing: No Appetite: No Relationship With Others: No Bladder Continence: No Emotions: No Bowel Continence: No Work: No Toileting: No Drive: No Dressing: No Hobbies: No Engineer, maintenance) Signed: 03/01/2021 5:45:17 PM By: Levan Hurst RN, BSN Entered By: Levan Hurst on 03/01/2021 16:05:32 -------------------------------------------------------------------------------- Patient/Caregiver Education Details Patient Name: Date of Service: Ellen Patrick, Lisabeth Register 10/27/2022andnbsp3:15 PM Medical Record Number: [id number] Patient Account Number: [id number] Date of Birth/Gender: Treating RN: 1995-10-16 (25 y.o. Ellen Patrick Primary Care Physician: PA Haig Prophet, [place] Other Clinician: Referring Physician: Treating Physician/Extender: Solon Augusta in Treatment: 4 Education Assessment Education Provided  To: Patient Education Topics Provided Wound/Skin Impairment: Methods: Explain/Verbal Responses: State content correctly Electronic Signature(s) Signed: 03/01/2021 5:45:17 PM By: Levan Hurst RN, BSN Entered By: Levan Hurst on 03/01/2021 16:24:54 -------------------------------------------------------------------------------- Wound Assessment Details Patient Name: Date of Service: Ellen Patrick, Ellen V EN L. 03/01/2021 3:15 PM Medical Record Number: [id number] Patient Account Number: [id number] Date of Birth/Sex: Treating RN: [birth year] (25 y.o. Ellen Patrick, Meta.Reding Primary Care Consepcion Utt: PA TIENT, [place] Other Clinician: Referring Latrise Bowland: Treating Jerrilyn Messinger/Extender: Solon Augusta in Treatment: 4 Wound Status Wound Number: 1 Primary Etiology: Atypical Wound Location: Right, Anterior Lower Leg Wound Status: Open Wounding Event: Gradually Appeared Comorbid History: Crohns Date Acquired: 01/04/2021 Weeks Of Treatment: 4 Clustered Wound: No Photos Wound Measurements Length: (cm) 1 Width: (cm) 0.4 Depth: (cm) 0.1 Area: (cm) 0.314 Volume: (cm) 0.031 % Reduction in Area: 63.7% % Reduction in Volume: 82.1% Epithelialization: Medium (34-66%) Tunneling: No Undermining: No Wound Description Classification: Full Thickness With Exposed Support Structures Wound Margin: Distinct, outline attached Exudate Amount: Medium Exudate Type: Serosanguineous Exudate Color: red, brown Foul Odor After Cleansing: No Slough/Fibrino Yes Wound Bed Granulation Amount: Large (67-100%) Exposed Structure Granulation Quality: Red, Pink Fascia Exposed: No Necrotic Amount: None Present (0%) Fat Layer (Subcutaneous Tissue) Exposed: Yes Tendon Exposed: No Muscle Exposed: No Joint Exposed: No Bone Exposed: No Electronic Signature(s) Signed: 03/01/2021 5:43:20 PM By: Deon Pilling RN, BSN Signed: 03/01/2021 5:45:17 PM By: Levan Hurst RN, BSN Entered By: Levan Hurst on 03/01/2021  16:03:35 -------------------------------------------------------------------------------- Wound Assessment Details Patient Name: Date of Service: Ellen Patrick, Ellen V EN L. 03/01/2021 3:15 PM Medical Record Number: [id number] Patient Account Number: [id number] Date of Birth/Sex: Treating RN: 30-Jan-1996 (26 y.o. Ellen Patrick, Meta.Reding Primary Care Edker Punt: PA TIENT, [place] Other Clinician: Referring Ariam Mol: Treating Demarkus Remmel/Extender: Solon Augusta in Treatment: 4 Wound Status Wound Number: 2 Primary Etiology: Infection - not elsewhere classified Wound Location: Left, Medial Lower Leg Wound Status: Open Wounding Event: Gradually Appeared Comorbid History: Crohns Date Acquired: 01/04/2021 Weeks Of Treatment: 4 Clustered Wound: No Photos Wound Measurements Length: (cm) 0.7 Width: (cm) 0.9 Depth: (cm) 0.1 Area: (cm) 0.495 Volume: (cm) 0.049 % Reduction in Area: 74.8% % Reduction in Volume: 75% Epithelialization: None Tunneling: No Undermining: No Wound Description Classification: Full Thickness With Exposed Support Structures Wound Margin: Distinct, outline attached Exudate Amount: Medium Exudate Type: Serosanguineous Exudate Color: red, brown Foul Odor After Cleansing: No Slough/Fibrino Yes Wound Bed Granulation Amount: Small (1-33%) Exposed Structure Granulation Quality: Red Fascia Exposed: No Necrotic Amount: Small (1-33%) Fat Layer (Subcutaneous Tissue) Exposed: Yes Necrotic Quality: Eschar Tendon Exposed: No Muscle Exposed: No Joint Exposed: No Bone Exposed: No Electronic Signature(s) Signed: 03/01/2021 5:43:20 PM By: Deon Pilling RN, BSN Signed: 03/01/2021 5:45:17 PM By: Levan Hurst RN, BSN Entered By: Levan Hurst on 03/01/2021 16:02:01 --------------------------------------------------------------------------------  Wound Assessment Details Patient Name: Date of Service: Ellen Patrick EN L. 03/01/2021 3:15 PM Medical Record Number:  [id number] Patient Account Number: [id number] Date of Birth/Sex: Treating RN: [birth year] (25 y.o. Ellen Patrick, Meta.Reding Primary Care Wenceslaus Gist: PA TIENT, [place] Other Clinician: Referring Alera Quevedo: Treating Obryan Radu/Extender: Solon Augusta in Treatment: 4 Wound Status Wound Number: 3 Primary Etiology: Atypical Wound Location: Left, Posterior Lower Leg Wound Status: Open Wounding Event: Gradually Appeared Comorbid History: Crohns Date Acquired: 01/04/2021 Weeks Of Treatment: 4 Clustered Wound: Yes Photos Wound Measurements Length: (cm) 2.3 Width: (cm) 2.4 Depth: (cm) 0.1 Clustered Quantity: 3 Area: (cm) 4.335 Volume: (cm) 0.434 % Reduction in Area: -224.7% % Reduction in Volume: -62.5% Epithelialization: None Tunneling: No Undermining: No Wound Description Classification: Full Thickness With Exposed Support Structures Wound Margin: Distinct, outline attached Exudate Amount: Medium Exudate Type: Serosanguineous Exudate Color: red, brown Foul Odor After Cleansing: No Slough/Fibrino Yes Wound Bed Granulation Amount: Medium (34-66%) Exposed Structure Granulation Quality: Red, Pink Fascia Exposed: No Necrotic Amount: Medium (34-66%) Fat Layer (Subcutaneous Tissue) Exposed: Yes Necrotic Quality: Adherent Slough Tendon Exposed: No Muscle Exposed: No Joint Exposed: No Bone Exposed: No Electronic Signature(s) Signed: 03/01/2021 5:43:20 PM By: Deon Pilling RN, BSN Signed: 03/01/2021 5:45:17 PM By: Levan Hurst RN, BSN Entered By: Levan Hurst on 03/01/2021 16:04:21 -------------------------------------------------------------------------------- Wound Assessment Details Patient Name: Date of Service: Ellen Patrick, Ellen V EN L. 03/01/2021 3:15 PM Medical Record Number: [id number] Patient Account Number: [id number] Date of Birth/Sex: Treating RN: 1995/10/26 (25 y.o. Ellen Patrick, Tammi Klippel Primary Care Rella Egelston: PA [place], [place] Other Clinician: Referring  Renne Platts: Treating Lucretia Pendley/Extender: Solon Augusta in Treatment: 4 Wound Status Wound Number: 4 Primary Etiology: Atypical Wound Location: Left T Second oe Wound Status: Open Wounding Event: Gradually Appeared Comorbid History: Crohns Date Acquired: 01/04/2021 Weeks Of Treatment: 4 Clustered Wound: Yes Photos Wound Measurements Length: (cm) 3 Width: (cm) 2.5 Depth: (cm) 0.2 Clustered Quantity: 2 Area: (cm) 5.89 Volume: (cm) 1.178 % Reduction in Area: -2905.1% % Reduction in Volume: -2920.5% Epithelialization: None Tunneling: No Undermining: No Wound Description Classification: Full Thickness With Exposed Support Structures Wound Margin: Distinct, outline attached Exudate Amount: Large Exudate Type: Serosanguineous Exudate Color: red, brown Foul Odor After Cleansing: No Slough/Fibrino Yes Wound Bed Granulation Amount: Medium (34-66%) Exposed Structure Granulation Quality: Red, Pink Fascia Exposed: No Necrotic Amount: Medium (34-66%) Fat Layer (Subcutaneous Tissue) Exposed: Yes Necrotic Quality: Adherent Slough Tendon Exposed: No Muscle Exposed: No Joint Exposed: No Bone Exposed: No Electronic Signature(s) Signed: 03/01/2021 5:43:20 PM By: Deon Pilling RN, BSN Signed: 03/01/2021 5:45:17 PM By: Levan Hurst RN, BSN Entered By: Levan Hurst on 03/01/2021 16:12:23 -------------------------------------------------------------------------------- Wound Assessment Details Patient Name: Date of Service: Ellen Patrick, Ellen V EN L. 03/01/2021 3:15 PM Medical Record Number: [id number] Patient Account Number: [id number] Date of Birth/Sex: Treating RN: 07-14-1995 (25 y.o. Ellen Patrick, Meta.Reding Primary Care Sherilynn Dieu: PA [place], [place] Other Clinician: Referring Javiel Canepa: Treating Jenniah Bhavsar/Extender: Solon Augusta in Treatment: 4 Wound Status Wound Number: 5 Primary Etiology: Atypical Wound Location: Right, Medial Lower Leg Wound Status:  Open Wounding Event: Bump Comorbid History: Crohns Date Acquired: 02/26/2021 Weeks Of Treatment: 0 Clustered Wound: Yes Photos Wound Measurements Length: (cm) 1.4 Width: (cm) 0.8 Depth: (cm) 0.4 Clustered Quantity: 2 Area: (cm) 0.88 Volume: (cm) 0.352 % Reduction in Area: 0% % Reduction in Volume: 0% Epithelialization: None Tunneling: No Undermining: No Wound Description Classification: Full Thickness Without Exposed Support Structures Wound Margin: Flat and Intact Exudate Amount: Medium  Exudate Type: Serosanguineous Exudate Color: red, brown Foul Odor After Cleansing: No Wound Bed Granulation Amount: Medium (34-66%) Exposed Structure Granulation Quality: Pink Fascia Exposed: No Necrotic Amount: Medium (34-66%) Fat Layer (Subcutaneous Tissue) Exposed: Yes Necrotic Quality: Adherent Slough Tendon Exposed: No Muscle Exposed: No Joint Exposed: No Bone Exposed: No Electronic Signature(s) Signed: 03/01/2021 5:43:20 PM By: Deon Pilling RN, BSN Signed: 03/01/2021 5:45:17 PM By: Levan Hurst RN, BSN Entered By: Levan Hurst on 03/01/2021 16:14:00 -------------------------------------------------------------------------------- Vitals Details Patient Name: Date of Service: Ellen Patrick, Ellen V EN L. 03/01/2021 3:15 PM Medical Record Number: [id number] Patient Account Number: [id number] Date of Birth/Sex: Treating RN: May 16, 1995 (25 y.o. Debby Bud Primary Care Bonetta Mostek: PA [place], [place] Other Clinician: Referring Devaunte Gasparini: Treating Cierra Rothgeb/Extender: Solon Augusta in Treatment: 4 Vital Signs Time Taken: 15:28 Temperature (F): 98.9 Height (in): 66 Pulse (bpm): 80 Weight (lbs): 230 Respiratory Rate (breaths/min): 14 Body Mass Index (BMI): 37.1 Blood Pressure (mmHg): 127/75 Reference Range: 80 - 120 mg / dl Electronic Signature(s) Signed: 03/01/2021 5:01:05 PM By: Dellie Catholic RN Entered By: Dellie Catholic on 03/01/2021 15:32:04

## 2021-03-06 NOTE — Progress Notes (Signed)
DEALIE, KOELZER ([id number]) Visit Report for 03/01/2021 HPI Details Patient Name: Date of Service: FO RD, Fayrene Helper EN L. 03/01/2021 3:15 PM Medical Record Number: [id number] Patient Account Number: [id number] Date of Birth/Sex: Treating RN: December 26, 1995 (25 y.o. [name] Primary Care Provider: PA Zenovia Jordan, [place] Other Clinician: Referring Provider: Treating Provider/Extender: Sheryle Spray in Treatment: 4 History of Present Illness HPI Description: ADMISSION 02/01/21; This is a 25 year old woman who had Crohn's disease or perhaps ulcerative colitis diagnosed by GI at [facility] some years ago. This was apparently confirmed at [facility] although the patient thinks they told her it was ulcerative colitis. The most recent notes I see are from 9 at 2020 at which times that they are quoting Crohn's disease. She was supposed to be on mesalamine but it was not really helping she stopped that and really has not had too much problems since. She denies diarrhea, abdominal pain bleeding or weight loss. She tells me that some years ago she started noticing subcutaneous nodules mostly on the inner part of both calves. These would come up and go down not usually open and not usually causing any pain. While vacationing in the [place] in August she developed open areas on both legs and the left second toe. She saw Dr. Allena Katz about the toe at which time he noted multiple pustules and put her on doxycycline and Cipro which she completed. Currently she has an open punched-out area on the right anterior tibia, left medial calf and a open area on the base of her left second toe. She shows me a nodule on the right lateral medial calf which is somewhat firm but nontender. On the left medial calf she has a small discolored area with a pustule. The open areas she says often started like this. She has not noticed any other skin issues The patient was in the ER on 8/17 and they referred her here wondering  about pyoderma gangrenosum. She also saw Dr. Allena Katz who noted multiple pustules in the left foot.. As noted put her on antibi 02/07/2021; patient is here for review of her bilateral lower legs and left foot wounds and skin conditions. She has punched-out areas on the right anterior tibial area collection on the right medial ankle and on the left foot between the first and second toes. In addition to this she has several small pustules 1 of which I cultured last week which showed rare MSSA. She also has some nodules most evident on the left foot. On the right leg laterally she has raised firm areas which look a lot like erythema nodosum. They are not particularly tender this would be in relation to her inflammatory bowel disease which she has been previously diagnosed with 10/27; its been a while since I have seen this woman. She has an interesting combination of findings in her bilateral lower legs. This includes slightly raised discolored nodular areas on her lower legs that go on to ulcerate and then seem to heal. She has Crohn's disease that is been fairly quiescent and some of the things I see on her legs almost look like erythema nodosum but I have not noticed that to ulcerate. She had some tiny pustules on her legs when I first saw her. Culture of 1 of these showed MSSA I gave her 10 days of Keflex which she completed since she was last here. She is also using Hibiclens soaks. She has multiple areas some of which seem quite bad this includes the  area on her left second toe. She has several scattered nodules and discoloration on the dorsal part of her foot at the base of her toes. Again several areas in both lower legs some of which are open and the rest of it appears to be scabbing over. I do not see any pustules today. The areas on the left second toe is very painful Electronic Signature(s) Signed: 03/06/2021 4:29:54 PM By: Linton Ham MD Entered By: Linton Ham on 03/01/2021  16:36:40 -------------------------------------------------------------------------------- Physical Exam Details Patient Name: Date of Service: FO RD, RA V EN L. 03/01/2021 3:15 PM Medical Record Number: [id number] Patient Account Number: [id number] Date of Birth/Sex: Treating RN: [birth year] (25 y.o. Debby Bud Primary Care Provider: PA Haig Prophet, [place] Other Clinician: Referring Provider: Treating Provider/Extender: Solon Augusta in Treatment: 4 Constitutional Sitting or standing Blood Pressure is within target range for patient.. Pulse regular and within target range for patient.Marland Kitchen Respirations regular, non-labored and within target range.. Temperature is normal and within the target range for the patient.Marland Kitchen Appears in no distress. Cardiovascular Pedal pulses palpable and strong bilaterally.. No signs of venous insufficiency. Notes Wound exam; again the patient has discolored slightly raised nodules that appear to ulcerate and then for the most part fade. She had pustules but I do not see any today. The area between her first and second toes has painful nodules over the top of her foot. I am not sure what this is. Electronic Signature(s) Signed: 03/06/2021 4:29:54 PM By: Linton Ham MD Entered By: Linton Ham on 03/01/2021 16:37:48 -------------------------------------------------------------------------------- Physician Orders Details Patient Name: Date of Service: FO RD, RA V EN L. 03/01/2021 3:15 PM Medical Record Number: [id number] Patient Account Number: [id number] Date of Birth/Sex: Treating RN: July 26, 1995 (25 y.o. [name] Primary Care Provider: PA TIENT, NO Other Clinician: Referring Provider: Treating Provider/Extender: Solon Augusta in Treatment: 4 Verbal / Phone Orders: No Diagnosis Coding ICD-10 Coding Code Description L97.818 Non-pressure chronic ulcer of other part of right lower leg with other specified  severity L97.928 Non-pressure chronic ulcer of unspecified part of left lower leg with other specified severity L97.528 Non-pressure chronic ulcer of other part of left foot with other specified severity K50.918 Crohn's disease, unspecified, with other complication [id number] Erythema nodosum A49.01 Methicillin susceptible Staphylococcus aureus infection, unspecified site Follow-up Appointments ppointment in 1 week. - Dr. Dellia Nims Return A Bathing/ Shower/ Hygiene May shower and wash wound with soap and water. - shower with hibiclens daily for 7 days, then weekly for 4 more weeks Edema Control - Lymphedema / SCD / Other Elevate legs to the level of the heart or above for 30 minutes daily and/or when sitting, a frequency of: - throughout the day Avoid standing for long periods of time. Wound Treatment Wound #1 - Lower Leg Wound Laterality: Right, Anterior Cleanser: Soap and Water 1 x Per [id number] Days Discharge Instructions: May shower and wash wound with dial antibacterial soap and water prior to dressing change. Cleanser: Wound Cleanser (Generic) 1 x Per Day/15 Days Discharge Instructions: Cleanse the wound with wound cleanser prior to applying a clean dressing using gauze sponges, not tissue or cotton balls. Prim Dressing: KerraCel Ag Gelling Fiber Dressing, 4x5 in (silver alginate) (Generic) 1 x Per Day/15 Days ary Discharge Instructions: Apply silver alginate to wound bed as instructed Secondary Dressing: Woven Gauze Sponge, Non-Sterile 4x4 in (Generic) 1 x Per Day/15 Days Discharge Instructions: Apply over primary dressing as directed. Secondary Dressing: Bordered Gauze, 4x4 in (Generic) 1  x Per [id number] Days Discharge Instructions: Apply over primary dressing as directed. Wound #2 - Lower Leg Wound Laterality: Left, Medial Cleanser: Soap and Water Every Other Day/15 Days Discharge Instructions: May shower and wash wound with dial antibacterial soap and water prior to dressing change. Cleanser:  Wound Cleanser (Generic) Every Other Day/15 Days Discharge Instructions: Cleanse the wound with wound cleanser prior to applying a clean dressing using gauze sponges, not tissue or cotton balls. Prim Dressing: KerraCel Ag Gelling Fiber Dressing, 4x5 in (silver alginate) (Generic) Every Other Day/15 Days ary Discharge Instructions: Apply silver alginate to wound bed as instructed Secondary Dressing: Woven Gauze Sponge, Non-Sterile 4x4 in (Generic) Every Other Day/15 Days Discharge Instructions: Apply over primary dressing as directed. Secondary Dressing: Bordered Gauze, 4x4 in (Generic) Every Other Day/15 Days Discharge Instructions: Apply over primary dressing as directed. Wound #3 - Lower Leg Wound Laterality: Left, Posterior Cleanser: Soap and Water Every Other Day/15 Days Discharge Instructions: May shower and wash wound with dial antibacterial soap and water prior to dressing change. Cleanser: Wound Cleanser (Generic) Every Other Day/15 Days Discharge Instructions: Cleanse the wound with wound cleanser prior to applying a clean dressing using gauze sponges, not tissue or cotton balls. Prim Dressing: KerraCel Ag Gelling Fiber Dressing, 4x5 in (silver alginate) (Generic) Every Other Day/15 Days ary Discharge Instructions: Apply silver alginate to wound bed as instructed Secondary Dressing: Woven Gauze Sponge, Non-Sterile 4x4 in (Generic) Every Other Day/15 Days Discharge Instructions: Apply over primary dressing as directed. Secondary Dressing: Bordered Gauze, 4x4 in (Generic) Every Other Day/15 Days Discharge Instructions: Apply over primary dressing as directed. Wound #4 - T Second oe Wound Laterality: Left Cleanser: Soap and Water Every Other Day/15 Days Discharge Instructions: May shower and wash wound with dial antibacterial soap and water prior to dressing change. Cleanser: Wound Cleanser (Generic) Every Other Day/15 Days Discharge Instructions: Cleanse the wound with wound cleanser  prior to applying a clean dressing using gauze sponges, not tissue or cotton balls. Prim Dressing: KerraCel Ag Gelling Fiber Dressing, 4x5 in (silver alginate) (Generic) Every Other Day/15 Days ary Discharge Instructions: Apply silver alginate to wound bed as instructed Secondary Dressing: Woven Gauze Sponge, Non-Sterile 4x4 in (Generic) Every Other Day/15 Days Discharge Instructions: Apply over primary dressing as directed. Secondary Dressing: Bordered Gauze, 4x4 in (Generic) Every Other Day/15 Days Discharge Instructions: Apply over primary dressing as directed. Wound #5 - Lower Leg Wound Laterality: Right, Medial Cleanser: Soap and Water Every Other Day/15 Days Discharge Instructions: May shower and wash wound with dial antibacterial soap and water prior to dressing change. Cleanser: Wound Cleanser (Generic) Every Other Day/15 Days Discharge Instructions: Cleanse the wound with wound cleanser prior to applying a clean dressing using gauze sponges, not tissue or cotton balls. Prim Dressing: KerraCel Ag Gelling Fiber Dressing, 4x5 in (silver alginate) (Generic) Every Other Day/15 Days ary Discharge Instructions: Apply silver alginate to wound bed as instructed Secondary Dressing: Woven Gauze Sponge, Non-Sterile 4x4 in (Generic) Every Other Day/15 Days Discharge Instructions: Apply over primary dressing as directed. Secondary Dressing: Bordered Gauze, 4x4 in (Generic) Every Other Day/15 Days Discharge Instructions: Apply over primary dressing as directed. Patient Medications llergies: No Known Allergies A Notifications Medication Indication Start End wound infection 03/01/2021 doxycycline monohydrate DOSE oral 100 mg capsule - 1 capsule oral bid for 10 days Electronic Signature(s) Signed: 03/01/2021 4:42:08 PM By: Linton Ham MD Entered By: Linton Ham on 03/01/2021 16:42:07 -------------------------------------------------------------------------------- Problem List  Details Patient Name: Date of Service: FO RD, RA V EN  L. 03/01/2021 3:15 PM Medical Record Number: [id number] Patient Account Number: [id number] Date of Birth/Sex: Treating RN: 04-02-1996 (25 y.o. [name] Primary Care Provider: PA TIENT, NO Other Clinician: Referring Provider: Treating Provider/Extender: Solon Augusta in Treatment: 4 Active Problems ICD-10 Encounter Code Description Active Date MDM Diagnosis L97.818 Non-pressure chronic ulcer of other part of right lower leg with other specified 02/01/2021 No Yes severity L97.928 Non-pressure chronic ulcer of unspecified part of left lower leg with other 02/01/2021 No Yes specified severity L97.528 Non-pressure chronic ulcer of other part of left foot with other specified 02/01/2021 No Yes severity K50.918 Crohn's disease, unspecified, with other complication [id number] No Yes L52 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 03/01/2021 3:15 PM Medical Record Number: [id number] Patient Account Number: [id number] Date of Birth/Sex: Treating RN: 01-12-1996 (25 y.o. Debby Bud Primary Care Provider: PA Haig Prophet, [place] Other Clinician: Referring Provider: Treating Provider/Extender: Solon Augusta in Treatment: 4 Subjective History of Present Illness (HPI) ADMISSION 02/01/21; This is a 25 year old woman who had Crohn's disease or perhaps ulcerative colitis diagnosed by GI at [facility] some years ago. This was apparently confirmed at [facility] although the patient thinks they told her  it was ulcerative colitis. The most recent notes I see are from 9 at 2020 at which times that they are quoting Crohn's disease. She was supposed to be on mesalamine but it was not really helping she stopped that and really has not had too much problems since. She denies diarrhea, abdominal pain bleeding or weight loss. She tells me that some years ago she started noticing subcutaneous nodules mostly on the inner part of both calves. These would come up and go down not usually open and not usually causing any pain. While vacationing in the [place] in August she developed open areas on both legs and the left second toe. She saw Dr. Posey Pronto about the toe at which time he noted multiple pustules and put her on doxycycline and Cipro which she completed. Currently she has an open punched-out area on the right anterior tibia, left medial calf and a open area on the base of her left second toe. She shows me a nodule on the right lateral medial calf which is somewhat firm but nontender. On the left medial calf she has a small discolored area with a pustule. The open areas she says often started like this. She has not noticed any other skin issues The patient was in the ER on 8/17 and they referred her here wondering about pyoderma gangrenosum. She also saw Dr. Posey Pronto who noted multiple pustules in the left foot.. As noted put her on antibi 02/07/2021; patient is here for review of her bilateral lower legs and left foot wounds and skin conditions. She has punched-out areas on the right anterior tibial area collection on the right medial ankle and on the left foot between the first and second toes. In addition to this she has several small pustules 1 of which I cultured last week which showed rare MSSA. She also has some nodules most evident on the left foot. On the right leg laterally she has raised firm areas which look a lot like erythema nodosum. They are not particularly  tender this would be in relation to  her inflammatory bowel disease which she has been previously diagnosed with 10/27; its been a while since I have seen this woman. She has an interesting combination of findings in her bilateral lower legs. This includes slightly raised discolored nodular areas on her lower legs that go on to ulcerate and then seem to heal. She has Crohn's disease that is been fairly quiescent and some of the things I see on her legs almost look like erythema nodosum but I have not noticed that to ulcerate. She had some tiny pustules on her legs when I first saw her. Culture of 1 of these showed MSSA I gave her 10 days of Keflex which she completed since she was last here. She is also using Hibiclens soaks. She has multiple areas some of which seem quite bad this includes the area on her left second toe. She has several scattered nodules and discoloration on the dorsal part of her foot at the base of her toes. Again several areas in both lower legs some of which are open and the rest of it appears to be scabbing over. I do not see any pustules today. The areas on the left second toe is very painful Objective Constitutional Sitting or standing Blood Pressure is within target range for patient.. Pulse regular and within target range for patient.Marland Kitchen Respirations regular, non-labored and within target range.. Temperature is normal and within the target range for the patient.Marland Kitchen Appears in no distress. Vitals Time Taken: 3:28 PM, Height: 66 in, Weight: 230 lbs, BMI: 37.1, Temperature: 98.9 F, Pulse: 80 bpm, Respiratory Rate: 14 breaths/min, Blood Pressure: 127/75 mmHg. Cardiovascular Pedal pulses palpable and strong bilaterally.. No signs of venous insufficiency. General Notes: Wound exam; again the patient has discolored slightly raised nodules that appear to ulcerate and then for the most part fade. She had pustules but I do not see any today. The area between her first and second toes has painful nodules over the top of  her foot. I am not sure what this is. Integumentary (Hair, Skin) Wound #1 status is Open. Original cause of wound was Gradually Appeared. The date acquired was: 01/04/2021. The wound has been in treatment 4 weeks. The wound is located on the Right,Anterior Lower Leg. The wound measures 1cm length x 0.4cm width x 0.1cm depth; 0.314cm^2 area and 0.031cm^3 volume. There is Fat Layer (Subcutaneous Tissue) exposed. There is no tunneling or undermining noted. There is a medium amount of serosanguineous drainage noted. The wound margin is distinct with the outline attached to the wound base. There is large (67-100%) red, pink granulation within the wound bed. There is no necrotic tissue within the wound bed. Wound #2 status is Open. Original cause of wound was Gradually Appeared. The date acquired was: 01/04/2021. The wound has been in treatment 4 weeks. The wound is located on the Left,Medial Lower Leg. The wound measures 0.7cm length x 0.9cm width x 0.1cm depth; 0.495cm^2 area and 0.049cm^3 volume. There is Fat Layer (Subcutaneous Tissue) exposed. There is no tunneling or undermining noted. There is a medium amount of serosanguineous drainage noted. The wound margin is distinct with the outline attached to the wound base. There is small (1-33%) red granulation within the wound bed. There is a small (1-33%) amount of necrotic tissue within the wound bed including Eschar. Wound #3 status is Open. Original cause of wound was Gradually Appeared. The date acquired was: 01/04/2021. The wound has been in treatment 4 weeks. The  wound is located on the Left,Posterior Lower Leg. The wound measures 2.3cm length x 2.4cm width x 0.1cm depth; 4.335cm^2 area and 0.434cm^3 volume. There is Fat Layer (Subcutaneous Tissue) exposed. There is no tunneling or undermining noted. There is a medium amount of serosanguineous drainage noted. The wound margin is distinct with the outline attached to the wound base. There is medium  (34-66%) red, pink granulation within the wound bed. There is a medium (34-66%) amount of necrotic tissue within the wound bed including Adherent Slough. Wound #4 status is Open. Original cause of wound was Gradually Appeared. The date acquired was: 01/04/2021. The wound has been in treatment 4 weeks. The wound is located on the Left T Second. The wound measures 3cm length x 2.5cm width x 0.2cm depth; 5.89cm^2 area and 1.178cm^3 volume. There is Fat oe Layer (Subcutaneous Tissue) exposed. There is no tunneling or undermining noted. There is a large amount of serosanguineous drainage noted. The wound margin is distinct with the outline attached to the wound base. There is medium (34-66%) red, pink granulation within the wound bed. There is a medium (34-66%) amount of necrotic tissue within the wound bed including Adherent Slough. Wound #5 status is Open. Original cause of wound was Bump. The date acquired was: 02/26/2021. The wound is located on the Right,Medial Lower Leg. The wound measures 1.4cm length x 0.8cm width x 0.4cm depth; 0.88cm^2 area and 0.352cm^3 volume. There is Fat Layer (Subcutaneous Tissue) exposed. There is no tunneling or undermining noted. There is a medium amount of serosanguineous drainage noted. The wound margin is flat and intact. There is medium (34- 66%) pink granulation within the wound bed. There is a medium (34-66%) amount of necrotic tissue within the wound bed including Adherent Slough. 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 - Dr. Dellia Nims Bathing/ Shower/ Hygiene: May shower and wash wound with soap and water. - shower with hibiclens daily for 7 days, then weekly for 4 more weeks Edema Control - Lymphedema / SCD / Other: Elevate legs to the level of the heart or above for 30 minutes daily and/or when sitting, a frequency of: - throughout the day Avoid standing for long periods of time. WOUND #1: - Lower Leg Wound Laterality: Right, Anterior Cleanser: Soap and Water 1 x Per Day/15 Days Discharge Instructions: May shower and wash wound with dial antibacterial soap and water prior to dressing change. Cleanser: Wound Cleanser (Generic) 1 x Per Day/15 Days Discharge Instructions: Cleanse the wound with wound cleanser prior to applying a clean dressing using gauze sponges, not tissue or cotton balls. Prim Dressing: KerraCel Ag Gelling Fiber Dressing, 4x5 in (silver alginate) (Generic) 1 x Per Day/15 Days ary Discharge Instructions: Apply silver alginate to wound bed as instructed Secondary Dressing: Woven Gauze Sponge, Non-Sterile 4x4 in (Generic) 1 x Per Day/15 Days Discharge Instructions: Apply over primary dressing as directed. Secondary Dressing: Bordered Gauze, 4x4 in (Generic) 1 x Per Day/15 Days Discharge Instructions: Apply over primary dressing as directed. WOUND #2: - Lower Leg Wound Laterality: Left, Medial Cleanser: Soap and Water Every Other Day/15 Days Discharge Instructions: May shower and wash wound with dial antibacterial  soap and water prior to dressing change. Cleanser: Wound Cleanser (Generic) Every Other Day/15 Days Discharge Instructions: Cleanse the wound with wound cleanser prior to applying a clean dressing using gauze sponges, not tissue or cotton balls. Prim Dressing: KerraCel Ag Gelling Fiber Dressing, 4x5 in (silver alginate) (Generic) Every Other Day/15 Days ary Discharge Instructions: Apply silver alginate to wound bed as instructed Secondary Dressing: Woven Gauze  Sponge, Non-Sterile 4x4 in (Generic) Every Other Day/15 Days Discharge Instructions: Apply over primary dressing as directed. Secondary Dressing: Bordered Gauze, 4x4 in (Generic) Every Other Day/15 Days Discharge Instructions: Apply over primary dressing as directed. WOUND #3: - Lower Leg Wound Laterality: Left, Posterior Cleanser: Soap and Water Every Other Day/15 Days Discharge Instructions: May shower and wash wound with dial antibacterial soap and water prior to dressing change. Cleanser: Wound Cleanser (Generic) Every Other Day/15 Days Discharge Instructions: Cleanse the wound with wound cleanser prior to applying a clean dressing using gauze sponges, not tissue or cotton balls. Prim Dressing: KerraCel Ag Gelling Fiber Dressing, 4x5 in (silver alginate) (Generic) Every Other Day/15 Days ary Discharge Instructions: Apply silver alginate to wound bed as instructed Secondary Dressing: Woven Gauze Sponge, Non-Sterile 4x4 in (Generic) Every Other Day/15 Days Discharge Instructions: Apply over primary dressing as directed. Secondary Dressing: Bordered Gauze, 4x4 in (Generic) Every Other Day/15 Days Discharge Instructions: Apply over primary dressing as directed. WOUND #4: - T Second Wound Laterality: Left oe Cleanser: Soap and Water Every Other Day/15 Days Discharge Instructions: May shower and wash wound with dial antibacterial soap and water prior to dressing change. Cleanser: Wound Cleanser (Generic) Every Other Day/15 Days Discharge Instructions: Cleanse the wound with wound cleanser prior to applying a clean dressing using gauze sponges, not tissue or cotton balls. Prim Dressing: KerraCel Ag Gelling Fiber Dressing, 4x5 in (silver alginate) (Generic) Every Other Day/15 Days ary Discharge Instructions: Apply silver alginate to wound bed as instructed Secondary Dressing: Woven Gauze Sponge, Non-Sterile 4x4 in (Generic) Every Other Day/15 Days Discharge Instructions: Apply over primary  dressing as directed. Secondary Dressing: Bordered Gauze, 4x4 in (Generic) Every Other Day/15 Days Discharge Instructions: Apply over primary dressing as directed. WOUND #5: - Lower Leg Wound Laterality: Right, Medial Cleanser: Soap and Water Every Other Day/15 Days Discharge Instructions: May shower and wash wound with dial antibacterial soap and water prior to dressing change. Cleanser: Wound Cleanser (Generic) Every Other Day/15 Days Discharge Instructions: Cleanse the wound with wound cleanser prior to applying a clean dressing using gauze sponges, not tissue or cotton balls. Prim Dressing: KerraCel Ag Gelling Fiber Dressing, 4x5 in (silver alginate) (Generic) Every Other Day/15 Days ary Discharge Instructions: Apply silver alginate to wound bed as instructed Secondary Dressing: Woven Gauze Sponge, Non-Sterile 4x4 in (Generic) Every Other Day/15 Days Discharge Instructions: Apply over primary dressing as directed. Secondary Dressing: Bordered Gauze, 4x4 in (Generic) Every Other Day/15 Days Discharge Instructions: Apply over primary dressing as directed. 1. I am going to give this patient another round of antibiotics doxycycline 100 twice daily for 2 weeks 2. She was trying to get into [facility] dermatology but they do not have an appointment for months. 3. She is an established patient with Dr. Allyn Kenner I have asked her to try to get in there. 4. I suspect something here needs to be biopsied but I am not willing to do this here 5. Still silver alginate. 6. 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 She had MSSA small pustules in her  legs when she came air and I gave her a round of antibiotics but this does not seem to have helped. Electronic Signature(s) Signed: 03/06/2021 4:29:54 PM By: Linton Ham MD Entered By: Linton Ham on 03/01/2021 16:39:09 -------------------------------------------------------------------------------- SuperBill  Details Patient Name: Date of Service: FO RD, RA V EN L. 03/01/2021 Medical Record Number: [id number] Patient Account Number: [id number] Date of Birth/Sex: Treating RN: 04-14-1996 (25 y.o. 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 Methicillin susceptible Staphylococcus aureus infection, unspecified site Facility Procedures CPT4 Code: [id number] Description: [phone] - WOUND CARE VISIT-LEV 5 EST PT Modifier: Quantity: 1 Physician Procedures : CPT4 Code Description Modifier [id number] - WC PHYS LEVEL 3 - EST PT ICD-10 Diagnosis Description L97.818 Non-pressure chronic ulcer of other part of right lower leg with other specified severity L97.528 Non-pressure chronic ulcer of other part of  left foot with other specified severity L97.928 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

## 2021-03-08 ENCOUNTER — Encounter (HOSPITAL_BASED_OUTPATIENT_CLINIC_OR_DEPARTMENT_OTHER): Payer: Commercial Managed Care - PPO | Attending: Internal Medicine | Admitting: Internal Medicine

## 2021-04-11 ENCOUNTER — Encounter: Payer: Self-pay | Admitting: Physician Assistant

## 2021-04-18 ENCOUNTER — Other Ambulatory Visit: Payer: Self-pay

## 2021-04-18 DIAGNOSIS — Z6838 Body mass index (BMI) 38.0-38.9, adult: Secondary | ICD-10-CM | POA: Insufficient documentation

## 2021-04-18 DIAGNOSIS — Q649 Congenital malformation of urinary system, unspecified: Secondary | ICD-10-CM | POA: Insufficient documentation

## 2021-04-18 DIAGNOSIS — K5090 Crohn's disease, unspecified, without complications: Secondary | ICD-10-CM | POA: Insufficient documentation

## 2021-04-24 ENCOUNTER — Encounter: Payer: Self-pay | Admitting: Physician Assistant

## 2021-04-24 ENCOUNTER — Ambulatory Visit (INDEPENDENT_AMBULATORY_CARE_PROVIDER_SITE_OTHER): Payer: Commercial Managed Care - PPO | Admitting: Physician Assistant

## 2021-04-24 ENCOUNTER — Other Ambulatory Visit (INDEPENDENT_AMBULATORY_CARE_PROVIDER_SITE_OTHER): Payer: Commercial Managed Care - PPO

## 2021-04-24 VITALS — BP 110/68 | HR 80 | Ht 65.75 in | Wt 246.1 lb

## 2021-04-24 DIAGNOSIS — K50119 Crohn's disease of large intestine with unspecified complications: Secondary | ICD-10-CM

## 2021-04-24 DIAGNOSIS — L88 Pyoderma gangrenosum: Secondary | ICD-10-CM

## 2021-04-24 LAB — IBC + FERRITIN
Ferritin: 23.2 ng/mL (ref 10.0–291.0)
Iron: 76 ug/dL (ref 42–145)
Saturation Ratios: 18.5 % — ABNORMAL LOW (ref 20.0–50.0)
TIBC: 411.6 ug/dL (ref 250.0–450.0)
Transferrin: 294 mg/dL (ref 212.0–360.0)

## 2021-04-24 LAB — HEPATIC FUNCTION PANEL
ALT: 11 U/L (ref 0–35)
AST: 13 U/L (ref 0–37)
Albumin: 4.1 g/dL (ref 3.5–5.2)
Alkaline Phosphatase: 77 U/L (ref 39–117)
Bilirubin, Direct: 0 mg/dL (ref 0.0–0.3)
Total Bilirubin: 0.3 mg/dL (ref 0.2–1.2)
Total Protein: 7.9 g/dL (ref 6.0–8.3)

## 2021-04-24 LAB — C-REACTIVE PROTEIN: CRP: 2.2 mg/dL (ref 0.5–20.0)

## 2021-04-24 LAB — SEDIMENTATION RATE: Sed Rate: 67 mm/hr — ABNORMAL HIGH (ref 0–20)

## 2021-04-24 MED ORDER — METOCLOPRAMIDE HCL 10 MG PO TABS: ORAL_TABLET | ORAL | 0 refills | Status: DC

## 2021-04-24 NOTE — Progress Notes (Signed)
Chief Complaint: Crohn's disease  HPI:    Ellen Patrick is a 25 past medical history as listed below including Crohn's disease and IBS, who has been following with digestive health services in [place] recently, but had previously followed with Dr. Loletha Carrow, and presents clinic today for complaint of Crohn's disease.    11/03/2015 colonoscopy with Crohn's disease with colonic involvement, inflammation in the colon and moderate severity new compared to previous exams and internal hemorrhoids.  She is started on a Prednisone taper.  Biopsy showed chronic mildly active colitis suggestive of IBD.     04/12/2016 patient seen in clinic by Dr. Loletha Carrow.  Had time a lot of her digestive symptoms had improved but checked a CRP, fecal WBCs, CBC and TSH check on status of Crohn's and thought maybe she needed a repeat colonoscopy.  At that time it was found that she is no longer anemic however inflammatory markers for the Crohn's may not be under control.  It is recommended that she have a repeat colonoscopy.    05/26/2018 patient's last office visit with digestive health and at that time was doing good overall she was still on Lialda but was not taking every day and took about 3 doses of 4 pills/week.  Was still having some nighttime BMs.  At that time checked a stool calprotectin and switched Lialda to 2 pills every day.  Also checked a CBC, CMP and iron profile.  That time iron studies were normal.    12/20/2020 CBC with a hemoglobin minimally decreased at 11.4, BMP normal.  Pregnancy test negative.    04/16/2021 patient seen for wound care.  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  It was discussed that she needed to see [place] as well as a dermatologist as this is autoimmune in nature.    Today, the patient presents to clinic accompanied by her mother.  She tells me that she went to [facility] to get a second opinion.  Apparently was told there that she likely had UC and not Crohn's, either way she was  continued on Lialda by them as well, but she just stopped taking this completely in 2020 because she felt like it made her symptoms worse.  Described an increase in abdominal discomfort when taking this medicine.  Overall GI wise she seems to be doing okay with 1-2 bowel movements a day which are either solid or soft but never diarrhea.  Denies any blood in her stool.  Does tell me that she has increased bilateral lower abdominal pain which seems to come and go, sometimes better after a bowel movement.    Most concerning to the patient is this pyoderma gangrenosum which she was told is likely related to her autoimmune condition.  Due to these lesions on her feet she has been staying out of work because she has to wear steel toed boots and just cannot stand to be on her feet with these lesions there.    Denies fever, chills, weight loss, blood in her stool or symptoms that awaken her from sleep.  Past Medical History:  Diagnosis Date   Crohn's colitis ([place])    IBS (irritable bowel syndrome)     Past Surgical History:  Procedure Laterality Date   WISDOM TOOTH EXTRACTION  01/2013    Current Outpatient Medications  Medication Sig Dispense Refill   Cholecalciferol (VITAMIN D-3) 125 MCG (5000 UT) TABS Take 1 tablet by mouth daily.     IRON PO Take 65 mg by mouth  Magnesium 250 MG TABS Take 2 tablets by mouth daily.    ° mupirocin ointment (BACTROBAN) 2 % Apply 1 application topically daily.    ° Probiotic Product (MISC INTESTINAL FLORA REGULAT) CAPS Take 1 tablet by mouth daily.    ° °No current facility-administered medications for this visit.  ° ° °Allergies as of 04/24/2021  ° (No Known Allergies)  ° ° °Family History  °Problem Relation Age of Onset  ° Leukemia Brother   ° Uterine cancer Paternal Aunt   ° Colon cancer Neg Hx   ° ° °Social History  ° °Socioeconomic History  ° Marital status: Single  °  Spouse name: Not on file  ° Number of children: 0  ° Years of education: Not on file  °  Highest education level: Not on file  °Occupational History  ° Occupation: student  °Tobacco Use  ° Smoking status: Never  ° Smokeless tobacco: Never  °Substance and Sexual Activity  ° Alcohol use: No  °  Alcohol/week: 0.0 standard drinks  ° Drug use: Yes  °  Types: Marijuana  ° Sexual activity: Not on file  °Other Topics Concern  ° Not on file  °Social History Narrative  ° Not on file  ° °Social Determinants of Health  ° °Financial Resource Strain: Not on file  °Food Insecurity: Not on file  °Transportation Needs: Not on file  °Physical Activity: Not on file  °Stress: Not on file  °Social Connections: Not on file  °Intimate Partner Violence: Not on file  ° ° °Review of Systems:    °Constitutional: No weight loss, fever or chills °Skin: +pyoderma on legs and feet °Cardiovascular: No chest pain °Respiratory: No SOB  °Gastrointestinal: See HPI and otherwise negative °Genitourinary: No dysuria  °Neurological: No headache, dizziness or syncope °Musculoskeletal: No new muscle or joint pain °Hematologic: No bleeding  °Psychiatric: No history of depression or anxiety ° ° Physical Exam:  °Vital signs: °BP 110/68 (BP Location: Left Arm, Patient Position: Sitting, Cuff Size: Normal)    Pulse 80    Ht 5' 5.75" (1.67 m) Comment: height measured without shoes   Wt 246 lb 2 oz (111.6 kg)    LMP 04/02/2021    BMI 40.03 kg/m²   ° °Constitutional:   Pleasant AA female appears to be in NAD, Well developed, Well nourished, alert and cooperative °Head:  Normocephalic and atraumatic. °Eyes:   PEERL, EOMI. No icterus. Conjunctiva pink. °Ears:  Normal auditory acuity. °Neck:  Supple °Throat: Oral cavity and pharynx without inflammation, swelling or lesion.  °Respiratory: Respirations even and unlabored. Lungs clear to auscultation bilaterally.   No wheezes, crackles, or rhonchi.  °Cardiovascular: Normal S1, S2. No MRG. Regular rate and rhythm. No peripheral edema, cyanosis or pallor.  °Gastrointestinal:  Soft, nondistended, nontender. No  rebound or guarding. Normal bowel sounds. No appreciable masses or hepatomegaly. °Rectal:  Not performed.  °Msk:  Symmetrical without gross deformities. Without edema, no deformity or joint abnormality.  °Neurologic:  Alert and  oriented x4;  grossly normal neurologically.  °Skin:   + Dry bandages bilaterally on feet and lower legs (did not attempt to take off) °Psychiatric: Demonstrates good judgement and reason without abnormal affect or behaviors. ° °RELEVANT LABS AND IMAGING: °CBC °   °Component Value Date/Time  ° WBC 5.8 12/20/2020 2157  ° RBC 4.16 12/20/2020 2157  ° HGB 11.4 (L) 12/20/2020 2157  ° HCT 36.0 12/20/2020 2157  ° PLT 350 12/20/2020 2157  ° MCV 86.5 12/20/2020 2157  °   MCH 27.4 12/20/2020 2157  ° MCHC 31.7 12/20/2020 2157  ° RDW 14.4 12/20/2020 2157  ° LYMPHSABS 2.2 12/20/2020 2157  ° MONOABS 0.4 12/20/2020 2157  ° EOSABS 0.1 12/20/2020 2157  ° BASOSABS 0.0 12/20/2020 2157  ° ° °CMP  °   °Component Value Date/Time  ° NA 138 12/20/2020 2157  ° K 3.8 12/20/2020 2157  ° CL 102 12/20/2020 2157  ° CO2 28 12/20/2020 2157  ° GLUCOSE 93 12/20/2020 2157  ° BUN 13 12/20/2020 2157  ° CREATININE 0.69 12/20/2020 2157  ° CALCIUM 9.4 12/20/2020 2157  ° PROT 7.5 10/16/2015 1834  ° ALBUMIN 3.5 10/16/2015 1834  ° AST 14 (L) 10/16/2015 1834  ° ALT 10 (L) 10/16/2015 1834  ° ALKPHOS 67 10/16/2015 1834  ° BILITOT 0.5 10/16/2015 1834  ° GFRNONAA >60 12/20/2020 2157  ° GFRAA >60 10/16/2015 1834  ° ° °Assessment: °1.  Crohn's disease: Found via colonoscopy 11/03/2015, patient initially responded to Prednisone taper and then Lialda which she discontinued in 2020 she felt it was making her symptoms worse, currently not really having GI symptoms other than some mild bilateral lower abdominal pain (could be Crohn's versus IBS), she is experiencing some extraluminal manifestations and is following with dermatology in regards to this  °2.  Pyoderma gangrenosum ° °Plan: °1.  Ordered labs today to include a hepatic function panel,  CRP and ESR as well as iron studies with ferritin °2.  Scheduled the patient for a colonoscopy to assess her Crohn's.  This was scheduled with Dr. Beavers due to her availability.  Did provide the patient a detailed list of risks of procedure and she agrees to proceed.  Will discuss with Dr. Danis if he will take patient back as she technically left our clinic and has now come back, if he does not then she would follow-up with Dr. Beavers as her primary GI physician here. °3.  Patient discussed FMLA paperwork with me.  Recommend that she follow-up with her dermatologist in regards to this paperwork since they are the ones treating her skin lesions.  She may need a round of Prednisone, but will wait on this until after time of colonoscopy given that she is not having any GI symptoms. °4.  Patient prescribed Reglan 10 mg 2 tabs, 1 tab 20 to 30 minutes before the first half of breath and another before the second given that she had nausea and vomiting last time. °5.  Patient to follow in clinic per recommendations after labs and colonoscopy above. ° ° , PA-C °[facility] °04/24/2021, 10:32 AM ° ° °

## 2021-04-24 NOTE — Patient Instructions (Addendum)
If you are age 25 or younger, your body mass index should be between 19-25. Your Body mass index is 40.03 kg/m. If this is out of the aformentioned range listed, please consider follow up with your Primary Care Provider.   The [place] [facility] providers would like to encourage you to use [facility] to communicate with providers for non-urgent requests or questions.  Due to long hold times on the telephone, sending your provider a message by [facility] may be faster and more efficient way to get a response. Please allow 48 business hours for a response.  Please remember that this is for non-urgent requests/questions.  PROCEDURES: You have been scheduled for a colonoscopy. Please follow the written instructions given to you at your visit today. If you use inhalers (even only as needed), please bring them with you on the day of your procedure.  MEDICATION: We have sent the following medication to your pharmacy for you to pick up at your convenience: Reglan 10 MG tablet. Take 1 tablet 20-30 minutes prior to each bowel prep.  LABS:  Lab work has been ordered for you today. Our lab is located in the basement. Press "B" on the elevator. The lab is located at the first door on the left as you exit the elevator.  HEALTHCARE LAWS AND MY CHART RESULTS: Due to recent changes in healthcare laws, you may see the results of your imaging and laboratory studies on MyChart before your provider has had a chance to review them.   We understand that in some cases there may be results that are confusing or concerning to you. Not all laboratory results come back in the same time frame and the provider may be waiting for multiple results in order to interpret others.  Please give [place] 48 hours in order for your provider to thoroughly review all the results before contacting the office for clarification of your results.   It was great seeing you today! Thank you for entrusting me with your care and choosing [facility].  Hyacinth Meeker, [place]

## 2021-05-01 ENCOUNTER — Ambulatory Visit (AMBULATORY_SURGERY_CENTER): Payer: Commercial Managed Care - PPO | Admitting: Gastroenterology

## 2021-05-01 ENCOUNTER — Encounter: Payer: Self-pay | Admitting: Gastroenterology

## 2021-05-01 ENCOUNTER — Other Ambulatory Visit: Payer: Commercial Managed Care - PPO

## 2021-05-01 ENCOUNTER — Other Ambulatory Visit: Payer: Self-pay

## 2021-05-01 VITALS — BP 118/60 | HR 75 | Temp 98.2°F | Resp 13 | Ht 65.0 in | Wt 246.0 lb

## 2021-05-01 DIAGNOSIS — K50119 Crohn's disease of large intestine with unspecified complications: Secondary | ICD-10-CM

## 2021-05-01 DIAGNOSIS — K5190 Ulcerative colitis, unspecified, without complications: Secondary | ICD-10-CM | POA: Diagnosis not present

## 2021-05-01 DIAGNOSIS — R109 Unspecified abdominal pain: Secondary | ICD-10-CM

## 2021-05-01 DIAGNOSIS — K529 Noninfective gastroenteritis and colitis, unspecified: Secondary | ICD-10-CM | POA: Diagnosis not present

## 2021-05-01 MED ORDER — PREDNISONE 10 MG PO TABS: 40.0000 mg | ORAL_TABLET | Freq: Every day | ORAL | 0 refills | Status: AC

## 2021-05-01 MED ORDER — SODIUM CHLORIDE 0.9 % IV SOLN: 500.0000 mL | Freq: Once | INTRAVENOUS | Status: DC

## 2021-05-01 NOTE — Progress Notes (Signed)
Called to room to assist during endoscopic procedure.  Patient ID and intended procedure confirmed with present staff. Received instructions for my participation in the procedure from the performing physician.  

## 2021-05-01 NOTE — Patient Instructions (Signed)
YOU HAD AN ENDOSCOPIC PROCEDURE TODAY: Refer to the procedure report and other information in the discharge instructions given to you for any specific questions about what was found during the examination. If this information does not answer your questions, please call [place] office at [phone] to clarify.  ° °YOU SHOULD EXPECT: Some feelings of bloating in the abdomen. Passage of more gas than usual. Walking can help get rid of the air that was put into your GI tract during the procedure and reduce the bloating. If you had a lower endoscopy (such as a colonoscopy or flexible sigmoidoscopy) you may notice spotting of blood in your stool or on the toilet paper. Some abdominal soreness may be present for a day or two, also. ° °DIET: Your first meal following the procedure should be a light meal and then it is ok to progress to your normal diet. A half-sandwich or bowl of soup is an example of a good first meal. Heavy or fried foods are harder to digest and may make you feel nauseous or bloated. Drink plenty of fluids but you should avoid alcoholic beverages for 24 hours. If you had a esophageal dilation, please see attached instructions for diet.   ° °ACTIVITY: Your care partner should take you home directly after the procedure. You should plan to take it easy, moving slowly for the rest of the day. You can resume normal activity the day after the procedure however YOU SHOULD NOT DRIVE, use power tools, machinery or perform tasks that involve climbing or major physical exertion for 24 hours (because of the sedation medicines used during the test).  ° °SYMPTOMS TO REPORT IMMEDIATELY: °A gastroenterologist can be reached at any hour. Please call [phone]  for any of the following symptoms:  °Following lower endoscopy (colonoscopy, flexible sigmoidoscopy) °Excessive amounts of blood in the stool  °Significant tenderness, worsening of abdominal pains  °Swelling of the abdomen that is new, acute  °Fever of 100° or  higher  °Following upper endoscopy (EGD, EUS, ERCP, esophageal dilation) °Vomiting of blood or coffee ground material  °New, significant abdominal pain  °New, significant chest pain or pain under the shoulder blades  °Painful or persistently difficult swallowing  °New shortness of breath  °Black, tarry-looking or red, bloody stools ° °FOLLOW UP:  °If any biopsies were taken you will be contacted by phone or by letter within the next 1-3 weeks. Call [phone]  if you have not heard about the biopsies in 3 weeks.  °Please also call with any specific questions about appointments or follow up tests. ° °

## 2021-05-01 NOTE — Progress Notes (Signed)
VS  PJ ° °Pt's states no medical or surgical changes since previsit or office visit. ° °

## 2021-05-01 NOTE — Op Note (Addendum)
[facility] Patient Name: Ellen Patrick Procedure Date: 05/01/2021 3:14 PM MRN: [id number] Endoscopist: Thornton Park MD, MD Age: 25 Referring MD:  Date of Birth: 1996/01/20 Gender: Female Account #: [id number] Procedure:                Colonoscopy Indications:              Abdominal pain                           Crohn's colitis diagnosed by colonoscopy in 2017.                            She was treated with a prednisone taper and Lialda.                            She tapered off of prednsione and has continued on                            Lialda until earlier this year when she felt the                            Lialda was making her symptoms worse. Was                            considered to be in remission until recent                            development of abdominal pain. No recent abdominal                            imaging. Last colonoscopy in 2017 with Dr. Loletha Carrow.                            Went to [facility] for a second opinion before                            recent symptom onset but desires ongoing care at                            [facility]. Medicines:                Monitored Anesthesia Care Procedure:                Pre-Anesthesia Assessment:                           - Prior to the procedure, a History and Physical                            was performed, and patient medications and                            allergies were reviewed. The patient's tolerance of  previous anesthesia was also reviewed. The risks                            and benefits of the procedure and the sedation                            options and risks were discussed with the patient.                            All questions were answered, and informed consent                            was obtained. Prior Anticoagulants: The patient has                            taken no previous anticoagulant or antiplatelet                            agents. ASA  Grade Assessment: II - A patient with                            mild systemic disease. After reviewing the risks                            and benefits, the patient was deemed in                            satisfactory condition to undergo the procedure.                           After obtaining informed consent, the colonoscope                            was passed under direct vision. Throughout the                            procedure, the patient's blood pressure, pulse, and                            oxygen saturations were monitored continuously. The                            Olympus CF-HQ190L ([id number]) Colonoscope was                            introduced through the anus and advanced to the 10                            cm into the ileum. The colonoscopy was performed                            without difficulty. The patient tolerated the  procedure well. The quality of the bowel                            preparation was good. The terminal ileum, ileocecal                            valve, appendiceal orifice, and rectum were                            photographed. Scope In: 3:31:11 PM Scope Out: 3:50:04 PM Scope Withdrawal Time: 0 hours 15 minutes 45 seconds  Total Procedure Duration: 0 hours 18 minutes 53 seconds  Findings:                 The perianal and digital rectal examinations were                            normal.                           Discontinuous areas of congested mucosa with deep,                            nonbleeding ulcers with no stigmata of recent                            bleeding were present in the entire colon. They                            were most pronounced in the right colon and                            descending colon. However, the sigmoid and rectum                            were essentially normal. The mucosa between areas                            of ulceration was normal. Biopsies were taken with                             a cold forceps for histology. Estimated blood loss                            was minimal.                           The terminal ileum appeared normal. A few                            pseudopolyps were present in the right colon.                            Biopsies were taken with a cold forceps for  histology. Estimated blood loss was minimal.                           The exam was otherwise without abnormality on                            direct and retroflexion views. Complications:            No immediate complications. Estimated blood loss:                            Minimal. Estimated Blood Loss:     Estimated blood loss was minimal. Impression:               - Suspected Crohn's colitis - moderate in severity.                            Biopsied.                           - The examined portion of the ileum was normal.                            Biopsied.                           - The examination was otherwise normal on direct                            and retroflexion views. Recommendation:           - Patient has a contact number available for                            emergencies. The signs and symptoms of potential                            delayed complications were discussed with the                            patient. Return to normal activities tomorrow.                            Written discharge instructions were provided to the                            patient.                           - Resume previous diet.                           - Continue present medications.                           - Start prednisone 40 mg daily x 2 weeks, then  reduce to 30 mg daily until the time of office                            follow-up.                           - Stop in the labs today: fecal calprotectin, GI                            pathogen panel, as well as HBV, HCV, and TB testing.                            - Await pathology results.                           - Office follow-up with Dr. Loletha Carrow or Anderson Malta -                            next available. Thornton Park MD, MD 05/01/2021 4:18:09 PM This report has been signed electronically.

## 2021-05-01 NOTE — Progress Notes (Signed)
° °  Referring Provider: Jarrett Soho, PA-C Primary Care Physician:  Jarrett Soho, PA-C  Reason for Procedure:  Lower abdominal pain   IMPRESSION:  Lower abdominal pain History of Crohn's disease Appropriate candidate for monitored anesthesia care  PLAN: Colonoscopy in the [facility] today   HPI: Ellen Patrick is a 25 y.o. female presents for colonoscopy to evaluate lower abdominal pain in the setting of a history of Crohn's disease. She is followed by Dr. Myrtie Neither but scheduled with me to expedite her evaluation. No change in history or physical exam following office visit 04/24/21.    Past Medical History:  Diagnosis Date   Crohn's colitis (HCC)    IBS (irritable bowel syndrome)     Past Surgical History:  Procedure Laterality Date   WISDOM TOOTH EXTRACTION  01/2013    Current Outpatient Medications  Medication Sig Dispense Refill   Cholecalciferol (VITAMIN D-3) 125 MCG (5000 UT) TABS Take 1 tablet by mouth daily.     fluconazole (DIFLUCAN) 150 MG tablet SMARTSIG:1 Tablet(s) By Mouth     IRON PO Take 65 mg by mouth daily.     Magnesium 250 MG TABS Take 2 tablets by mouth daily.     mupirocin ointment (BACTROBAN) 2 % Apply 1 application topically daily.     Probiotic Product (MISC INTESTINAL FLORA REGULAT) CAPS Take 1 tablet by mouth daily.     metoCLOPramide (REGLAN) 10 MG tablet Take 1 tablet 20-30 minutes before each part of bowel prep (Patient not taking: Reported on 05/01/2021) 2 tablet 0   Current Facility-Administered Medications  Medication Dose Route Frequency Provider Last Rate Last Admin   0.9 %  sodium chloride infusion  500 mL Intravenous Once Tressia Danas, MD        Allergies as of 05/01/2021   (No Known Allergies)    Family History  Problem Relation Age of Onset   Leukemia Brother    Prostate cancer Maternal Grandfather    Uterine cancer Paternal Aunt    Colon cancer Neg Hx      Physical Exam: General:   Alert,  well-nourished, pleasant and  cooperative in NAD Head:  Normocephalic and atraumatic. Eyes:  Sclera clear, no icterus.   Conjunctiva pink. Mouth:  No deformity or lesions.   Neck:  Supple; no masses or thyromegaly. Lungs:  Clear throughout to auscultation.   No wheezes. Heart:  Regular rate and rhythm; no murmurs. Abdomen:  Soft, non-tender, nondistended, normal bowel sounds, no rebound or guarding.  Msk:  Symmetrical. No boney deformities LAD: No inguinal or umbilical LAD Extremities:  No clubbing or edema. Neurologic:  Alert and  oriented x4;  grossly nonfocal Skin:  No obvious rash or bruise. Psych:  Alert and cooperative. Normal mood and affect.      Lipa Knauff L. Orvan Falconer, MD, MPH 05/01/2021, 3:19 PM

## 2021-05-01 NOTE — Progress Notes (Signed)
PT taken to PACU. Monitors in place. VSS. Report given to RN. 

## 2021-05-02 ENCOUNTER — Encounter: Payer: Self-pay | Admitting: Gastroenterology

## 2021-05-02 NOTE — Progress Notes (Signed)
Happy to help as the schedule allows.

## 2021-05-03 ENCOUNTER — Telehealth: Payer: Self-pay | Admitting: *Deleted

## 2021-05-03 ENCOUNTER — Other Ambulatory Visit: Payer: Commercial Managed Care - PPO

## 2021-05-03 DIAGNOSIS — K50119 Crohn's disease of large intestine with unspecified complications: Secondary | ICD-10-CM

## 2021-05-03 LAB — QUANTIFERON-TB GOLD PLUS
Mitogen-NIL: >10 IU/mL
NIL: 0.06 IU/mL
QuantiFERON-TB Gold Plus: NEGATIVE
TB1-NIL: 0.01 IU/mL
TB2-NIL: 0.01 IU/mL

## 2021-05-03 LAB — HEPATITIS C ANTIBODY
Hepatitis C Ab: NONREACTIVE
SIGNAL TO CUT-OFF: 0.04 (ref <1.00)

## 2021-05-03 LAB — HEPATITIS B CORE ANTIBODY, IGM: Hep B C IgM: NONREACTIVE

## 2021-05-03 LAB — HEPATITIS B SURFACE ANTIGEN: Hepatitis B Surface Ag: NONREACTIVE

## 2021-05-03 LAB — HEPATITIS B SURFACE ANTIBODY,QUALITATIVE: Hep B S Ab: NONREACTIVE

## 2021-05-03 LAB — HEPATITIS B CORE ANTIBODY, TOTAL: Hep B Core Total Ab: NONREACTIVE

## 2021-05-03 NOTE — Telephone Encounter (Signed)
°  Follow up Call-  Call back number 05/01/2021  Post procedure Call Back phone  # [phone]  Permission to leave phone message Yes  Some recent data might be hidden     Patient questions:  Do you have a fever, pain , or abdominal swelling? No. Pain Score  0 *  Have you tolerated food without any problems? Yes.    Have you been able to return to your normal activities? Yes.    Do you have any questions about your discharge instructions: Diet   No. Medications  No. Follow up visit  No.  Do you have questions or concerns about your Care? No.  Actions: * If pain score is 4 or above: No action needed, pain <4.  Have you developed a fever since your procedure? no  2.   Have you had an respiratory symptoms (SOB or cough) since your procedure? no  3.   Have you tested positive for COVID 19 since your procedure no  4.   Have you had any family members/close contacts diagnosed with the COVID 19 since your procedure?  no   If yes to any of these questions please route to Laverna Peace, RN and Karlton Lemon, RN

## 2021-05-06 LAB — GI PROFILE, STOOL, PCR

## 2021-05-06 LAB — CALPROTECTIN, FECAL: Calprotectin, Fecal: 644 ug/g — ABNORMAL HIGH (ref 0–120)

## 2021-05-08 DIAGNOSIS — L88 Pyoderma gangrenosum: Secondary | ICD-10-CM | POA: Diagnosis not present

## 2021-05-10 ENCOUNTER — Ambulatory Visit: Payer: Commercial Managed Care - PPO | Admitting: Gastroenterology

## 2021-05-14 ENCOUNTER — Encounter: Payer: Self-pay | Admitting: Gastroenterology

## 2021-05-14 ENCOUNTER — Ambulatory Visit (INDEPENDENT_AMBULATORY_CARE_PROVIDER_SITE_OTHER): Payer: BC Managed Care – PPO | Admitting: Gastroenterology

## 2021-05-14 ENCOUNTER — Telehealth: Payer: Self-pay

## 2021-05-14 VITALS — BP 104/78 | HR 89 | Ht 66.0 in | Wt 250.0 lb

## 2021-05-14 DIAGNOSIS — R1084 Generalized abdominal pain: Secondary | ICD-10-CM | POA: Diagnosis not present

## 2021-05-14 DIAGNOSIS — K5010 Crohn's disease of large intestine without complications: Secondary | ICD-10-CM | POA: Diagnosis not present

## 2021-05-14 DIAGNOSIS — L88 Pyoderma gangrenosum: Secondary | ICD-10-CM

## 2021-05-14 MED ORDER — PREDNISONE 10 MG PO TABS: ORAL_TABLET | ORAL | 0 refills | Status: AC

## 2021-05-14 NOTE — Patient Instructions (Addendum)
If you are age 26 or older, your body mass index should be between 23-30. Your Body mass index is 40.35 kg/m. If this is out of the aforementioned range listed, please consider follow up with your Primary Care Provider.  If you are age 35 or younger, your body mass index should be between 19-25. Your Body mass index is 40.35 kg/m. If this is out of the aformentioned range listed, please consider follow up with your Primary Care Provider.   ________________________________________________________  The [facility] providers would like to encourage you to use [facility] to communicate with providers for non-urgent requests or questions.  Due to long hold times on the telephone, sending your provider a message by [facility] may be a faster and more efficient way to get a response.  Please allow 48 business hours for a response.  Please remember that this is for non-urgent requests.  _______________________________________________________ prednisone taper. The taper instructions are as follows:  Prednisone 30 mg daily x 14 days  Prednisone 20 mg daily x 14 days  Prednisone 10 mg daily x 7 days  Then, discontinue.  Please call the office to schedule the needed vaccines - Prevnar 13 and Heplisav  These will need to be current in order to start Humira.   It was a pleasure to see you today!  Thank you for trusting me with your gastrointestinal care!

## 2021-05-14 NOTE — Telephone Encounter (Signed)
Attempted to reach patient twice. Her vm is full and cannot accept any new messages at this time. I need pt's prescription card information before I can begin PA for Humira. ([employer], [place], and [employer])

## 2021-05-14 NOTE — Progress Notes (Signed)
[facility] Progress Note  Chief Complaint: Crohn's colitis  Subjective  History: Ellen Patrick follows up for her Crohn's colitis.  She recently reestablished care after having been seen in the [facility] system for years and was off therapy.  Colonoscopy by Dr. Orvan Falconer found patchy ulcerative disease, most notably in the ascending, transverse and descending colon (rectosigmoid sparing).  She was started on a prednisone taper (40 mg once daily for 2 weeks, then down to 30 mg until this appointment) and lab work obtained for probable initiation of anti-TNF therapy.  Anglea was glad to report that her abdominal pain has significantly subsided on prednisone, and her pyoderma has gotten considerably better as well.  She was not having diarrhea or bleeding prior to the colonoscopy, and abdominal pain was her main symptom along with the skin condition.  ROS: Cardiovascular:  no chest pain Respiratory: no dyspnea Pyoderma, lately improved Remainder of systems negative except as above The patient's Past Medical, Family and Social History were reviewed and are on file in the EMR.  Objective:  Med list reviewed  Current Outpatient Medications:    Cholecalciferol (VITAMIN D-3) 125 MCG (5000 UT) TABS, Take 1 tablet by mouth daily., Disp: , Rfl:    IRON PO, Take 65 mg by mouth daily., Disp: , Rfl:    Magnesium 250 MG TABS, Take 2 tablets by mouth daily., Disp: , Rfl:    mupirocin ointment (BACTROBAN) 2 %, Apply 1 application topically daily., Disp: , Rfl:    Omega-3 Fatty Acids (FISH OIL PO), Take by mouth. 500mg  capsules: 2 daily, Disp: , Rfl:    predniSONE (DELTASONE) 10 MG tablet, Take 4 tablets (40 mg total) by mouth daily with breakfast. Take 40 mg PO daily x 2 weeks , then reduce to 30 mg PO daily, Disp: 240 tablet, Rfl: 0   predniSONE (DELTASONE) 10 MG tablet, Take 3 tablets (30 mg total) by mouth daily with breakfast for 14 days, THEN 2 tablets (20 mg total) daily with breakfast for 14 days,  THEN 1 tablet (10 mg total) daily with breakfast for 7 days., Disp: 77 tablet, Rfl: 0   Probiotic Product (MISC INTESTINAL FLORA REGULAT) CAPS, Take 1 tablet by mouth daily., Disp: , Rfl:    Vital signs in last 24 hrs: Vitals:   05/14/21 1319  BP: 104/78  Pulse: 89   Wt Readings from Last 3 Encounters:  05/14/21 250 lb (113.4 kg)  05/01/21 246 lb (111.6 kg)  04/24/21 246 lb 2 oz (111.6 kg)    Physical Exam  Well-appearing HEENT: sclera anicteric, oral mucosa moist without lesions Neck: supple, no thyromegaly, JVD or lymphadenopathy Cardiac: RRR without murmurs, S1S2 heard, no peripheral edema Pulm: clear to auscultation bilaterally, normal RR and effort noted Abdomen: soft, obese, no tenderness, with active bowel sounds. No guarding or palpable hepatosplenomegaly. Skin.  She had gauze bandages around the lower shins and area of pyoderma, and did not wish to remove them today but says they are much improved lately.  Labs:  CBC Latest Ref Rng & Units 12/20/2020 04/12/2016 01/16/2016  WBC 4.0 - 10.5 K/uL 5.8 4.4(L) 4.2(L)  Hemoglobin 12.0 - 15.0 g/dL 11.4(L) 12.5 11.0(L)  Hematocrit 36.0 - 46.0 % 36.0 37.2 33.0(L)  Platelets 150 - 400 K/uL 350 238.0 277.0   CMP Latest Ref Rng & Units 04/24/2021 12/20/2020 10/16/2015  Glucose 70 - 99 mg/dL - 93 73  BUN 6 - 20 mg/dL - 13 <1(E)  Creatinine 0.44 - 1.00 mg/dL -  0.69 0.80  Sodium 135 - 145 mmol/L - 138 137  Potassium 3.5 - 5.1 mmol/L - 3.8 3.7  Chloride 98 - 111 mmol/L - 102 103  CO2 22 - 32 mmol/L - 28 24  Calcium 8.9 - 10.3 mg/dL - 9.4 9.3  Total Protein 6.0 - 8.3 g/dL 7.9 - 7.5  Total Bilirubin 0.2 - 1.2 mg/dL 0.3 - 0.5  Alkaline Phos 39 - 117 U/L 77 - 67  AST 0 - 37 U/L 13 - 14(L)  ALT 0 - 35 U/L 11 - 10(L)   Iron/TIBC/Ferritin/ %Sat    Component Value Date/Time   IRON 76 04/24/2021 1120   TIBC 411.6 04/24/2021 1120   FERRITIN 23.2 04/24/2021 1120   IRONPCTSAT 18.5 (L) 04/24/2021 1120   Recent GI pathogen panel after  colonoscopy negative for infections.  Fecal calprotectin markedly elevated 644  Serologies: Hepatitis B surface antigen negative, surface antibody negative, core antibody negative, hepatitis C antibody negative  Colonoscopy report reviewed and findings discussed with Dr. Tarri Glenn.  TB QuantiFERON gold testing negative ____________________________________________ Other:  1. Surgical [P], colon, terminal ileum - ILEAL MUCOSA WITH NO SPECIFIC HISTOPATHOLOGIC CHANGES - NEGATIVE FOR ACUTE INFLAMMATION, FEATURES OF CHRONICITY OR GRANULOMAS 2. Surgical [P], right colon sites - MILDLY ACTIVE NONSPECIFIC COLITIS - NEGATIVE FOR ACUTE INFLAMMATION, FEATURES OF CHRONICITY, GRANULOMAS OR DYSPLASIA 3. Surgical [P], colon, transverse - SEVERELY ACTIVE NONSPECIFIC COLITIS WITH ULCERATION - NEGATIVE FOR DEFINITE FEATURES OF CHRONICITY, GRANULOMAS OR DYSPLASIA 4. Surgical [P], proximal left colon sites - SEVERELY ACTIVE NONSPECIFIC COLITIS WITH ULCERATION - NEGATIVE FOR DEFINITE FEATURES OF CHRONICITY, GRANULOMAS OR DYSPLASIA 5. Surgical [P], distal left colon sites - COLONIC MUCOSA WITH NO SPECIFIC HISTOPATHOLOGIC CHANGES - NEGATIVE FOR ACUTE INFLAMMATION, FEATURES OF CHRONICITY, GRANULOMAS OR DYSPLASIA 6. Surgical [P], colon, rectum - COLONIC MUCOSA WITH NO SPECIFIC HISTOPATHOLOGIC CHANGES - NEGATIVE FOR ACUTE INFLAMMATION, FEATURES OF CHRONICITY, GRANULOMAS OR DYSPLASIA _____________________________________________ Assessment & Plan  Assessment: Encounter Diagnoses  Name Primary?   Crohn's colitis, without complications ([place]) Yes   Generalized abdominal pain    Pyoderma gangrenosum    At least moderately active Crohn's colitis on recent colonoscopy.  Her main symptom of abdominal pain is much improved lately on prednisone, and she is due to decrease to 30 mg a day this week.  Her pyoderma is also improved on prednisone, indicating it is Crohn's related and mirroring the disease activity.  We  had a long discussion about her condition and the proposed treatment plan.  Mesalamine will not be sufficient long-term therapy for this.  Weyman Rodney is used, but given the significant ulcerative component of this on colonoscopy, I think anti-TNF therapy would be more successful.  We discussed either infusion such as Remicade or self-administered injection with Humira and I recommend the latter.  We discussed the immunosuppressive risk (primarily respiratory infections but other things as well), and the malignancy risk (including but not limited to small bowel lymphoma).  Due to that, she needs vaccination for hepatitis B because even though she was probably vaccinated in childhood, she has lost immunity with no detectable surface antibody.  She also needs pneumococcal vaccination, first the Prevnar 13 and later the pneumococcal 23.  She asked good questions, and was agreeable to starting Humira.  I also gave her the Crohn's and colitis foundation website info so she could read more about her condition and available therapies as well as risks and benefits.  Marybelle was reluctant to get the above vaccinations stating, "people have different opinions about  vaccines.".  Current guidelines recommend flu and COVID vaccinations regularly as well as hepatitis B and pneumococcal vaccines. I have told her that given the risks of Humira, I feel strongly that she should have the required vaccinations and that we could start the Humira after she has received the first Prevnar and hepatitis B vaccines.  She did not wish to do them today, and said she would return to clinic in the near future to do so after considering it further.  We will taper prednisone as follows: Decrease to 30 mg a day now, and in 2 weeks decrease to 20 mg a day for 2 weeks, then 10 mg a day for a week and then stop.  We will start initial insurance authorization for Humira, but wait on starting treatment pending her vaccinations as noted above. We  discussed the need for insurance authorization, the available support through [employer], how a person were to give themselves of vaccines, both induction and maintenance dosing, and all questions were answered.  Follow-up interval with me dependent on timing of Humira initiation.  40 minutes were spent on this encounter (including chart review, history/exam, counseling/coordination of care, and documentation) > 50% of that time was spent on counseling and coordination of care.   Nelida Meuse III

## 2021-05-14 NOTE — Telephone Encounter (Signed)
-----   Message from Linton Hall, MD sent at 05/14/2021  3:14 PM EST ----- Patient seen today for Crohn's colitis, please start insurance authorization for Humira, standard induction and maintenance dosing  Please review the assessment and plan of my note from today since patient needs some vaccinations prior to starting therapy.  HD

## 2021-05-15 NOTE — Telephone Encounter (Signed)
Attempted to reach pt. Her vm is full and cannot accept any new messages at this time. Will attempt to reach pt again at a later time.

## 2021-05-24 NOTE — Telephone Encounter (Signed)
3rd attempt to reach patient. Her vm is currently full and not able to accept any new messages at this time. Will attempt to reach pt via my chart.

## 2021-05-29 DIAGNOSIS — K5010 Crohn's disease of large intestine without complications: Secondary | ICD-10-CM | POA: Diagnosis not present

## 2021-05-29 DIAGNOSIS — L52 Erythema nodosum: Secondary | ICD-10-CM | POA: Diagnosis not present

## 2021-05-29 DIAGNOSIS — L88 Pyoderma gangrenosum: Secondary | ICD-10-CM | POA: Diagnosis not present

## 2021-05-29 NOTE — Telephone Encounter (Signed)
Dr. Myrtie Neither, I have made multiple attempts to reach pt for insurance information so that PA can be initiated for Humira. Pt has not returned any calls or responded to my chart message. My chart message was "Last read by Cleaster Corin at  6:47 PM on 05/24/2021." Please advise on how you wish to proceed. Thanks

## 2021-05-29 NOTE — Telephone Encounter (Signed)
Ellen Patrick is well aware of the treatment plan we discussed at the recent office visit. She also has not had her required vaccinations.  Send one last portal message, then it is up to her to contact us if she chooses to follow through with my treatment advice.  - HD

## 2021-05-29 NOTE — Telephone Encounter (Signed)
2nd mychart message sent to patient.

## 2021-06-04 NOTE — Telephone Encounter (Signed)
No response from patient, she has viewed my chart message. "Last read by Micael Hampshire at 11:49 AM on 05/29/2021"  Looks like patient went to see [place] at [facility] for a second opinion on 05/29/21.

## 2021-06-05 NOTE — Telephone Encounter (Signed)
Noted, thanks!

## 2021-06-05 NOTE — Telephone Encounter (Signed)
Thanks for the update. I reviewed that physician's note and it seems Aleda will be continuing care with that office.  You do not need to take further action regarding Humira.  Appreciate your efforts.  - HD

## 2021-06-14 ENCOUNTER — Ambulatory Visit: Payer: Commercial Managed Care - PPO | Admitting: Gastroenterology

## 2021-11-26 DIAGNOSIS — Z0000 Encounter for general adult medical examination without abnormal findings: Secondary | ICD-10-CM | POA: Diagnosis not present

## 2021-11-26 DIAGNOSIS — K5010 Crohn's disease of large intestine without complications: Secondary | ICD-10-CM | POA: Diagnosis not present

## 2021-11-26 DIAGNOSIS — Z13220 Encounter for screening for lipoid disorders: Secondary | ICD-10-CM | POA: Diagnosis not present

## 2021-11-27 DIAGNOSIS — K5010 Crohn's disease of large intestine without complications: Secondary | ICD-10-CM | POA: Diagnosis not present

## 2021-11-27 DIAGNOSIS — L52 Erythema nodosum: Secondary | ICD-10-CM | POA: Diagnosis not present

## 2021-11-27 DIAGNOSIS — L88 Pyoderma gangrenosum: Secondary | ICD-10-CM | POA: Diagnosis not present

## 2022-01-17 DIAGNOSIS — K5010 Crohn's disease of large intestine without complications: Secondary | ICD-10-CM | POA: Diagnosis not present

## 2022-04-30 DIAGNOSIS — Z01419 Encounter for gynecological examination (general) (routine) without abnormal findings: Secondary | ICD-10-CM | POA: Diagnosis not present

## 2022-04-30 DIAGNOSIS — Z113 Encounter for screening for infections with a predominantly sexual mode of transmission: Secondary | ICD-10-CM | POA: Diagnosis not present

## 2022-04-30 DIAGNOSIS — Z6841 Body Mass Index (BMI) 40.0 and over, adult: Secondary | ICD-10-CM | POA: Diagnosis not present

## 2022-05-10 DIAGNOSIS — R1013 Epigastric pain: Secondary | ICD-10-CM | POA: Diagnosis not present

## 2022-08-29 DIAGNOSIS — L88 Pyoderma gangrenosum: Secondary | ICD-10-CM | POA: Diagnosis not present

## 2022-08-29 DIAGNOSIS — K5010 Crohn's disease of large intestine without complications: Secondary | ICD-10-CM | POA: Diagnosis not present

## 2022-10-21 DIAGNOSIS — Z6841 Body Mass Index (BMI) 40.0 and over, adult: Secondary | ICD-10-CM | POA: Diagnosis not present

## 2022-10-21 DIAGNOSIS — J029 Acute pharyngitis, unspecified: Secondary | ICD-10-CM | POA: Diagnosis not present

## 2022-11-28 DIAGNOSIS — Z13220 Encounter for screening for lipoid disorders: Secondary | ICD-10-CM | POA: Diagnosis not present

## 2022-11-28 DIAGNOSIS — Z0000 Encounter for general adult medical examination without abnormal findings: Secondary | ICD-10-CM | POA: Diagnosis not present

## 2022-12-19 DIAGNOSIS — K5010 Crohn's disease of large intestine without complications: Secondary | ICD-10-CM | POA: Diagnosis not present

## 2022-12-19 DIAGNOSIS — L88 Pyoderma gangrenosum: Secondary | ICD-10-CM | POA: Diagnosis not present

## 2023-02-27 DIAGNOSIS — M79671 Pain in right foot: Secondary | ICD-10-CM | POA: Diagnosis not present

## 2023-02-27 DIAGNOSIS — K5010 Crohn's disease of large intestine without complications: Secondary | ICD-10-CM | POA: Diagnosis not present

## 2023-04-23 DIAGNOSIS — K5010 Crohn's disease of large intestine without complications: Secondary | ICD-10-CM | POA: Diagnosis not present

## 2023-04-23 DIAGNOSIS — Z1211 Encounter for screening for malignant neoplasm of colon: Secondary | ICD-10-CM | POA: Diagnosis not present

## 2023-05-05 IMAGING — CR DG FOOT COMPLETE 3+V*L*
3 series · 3 of 3 positions shown · non-contrast
Comparison: Foot radiograph 12/06/2020

CLINICAL DATA: Wounds. Patient reports wound infection for 2 weeks
with wound drainage.

EXAM:
LEFT FOOT - COMPLETE 3+ VIEW

[x foot ap left]
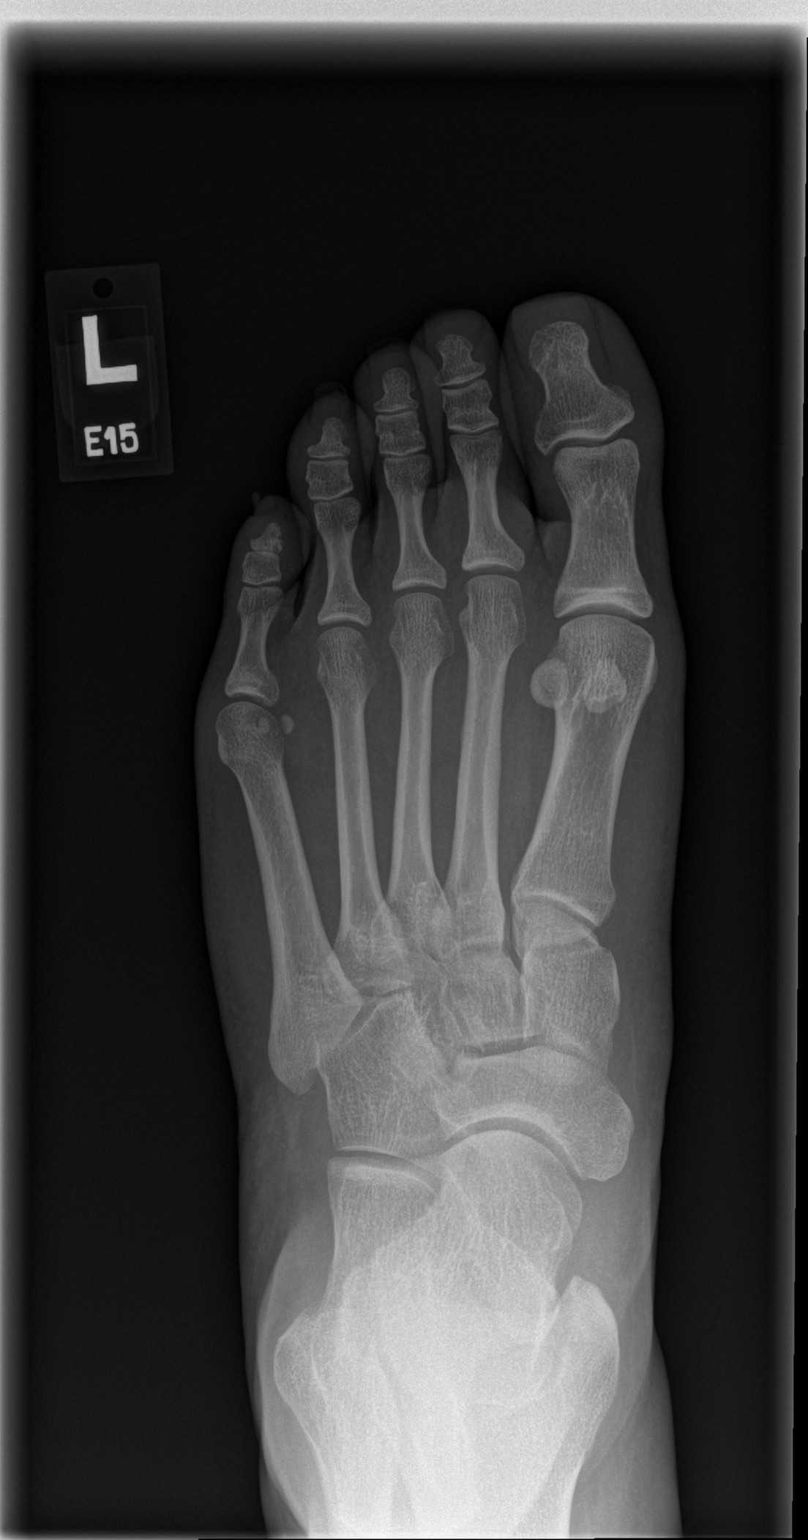

[x foot lat left]
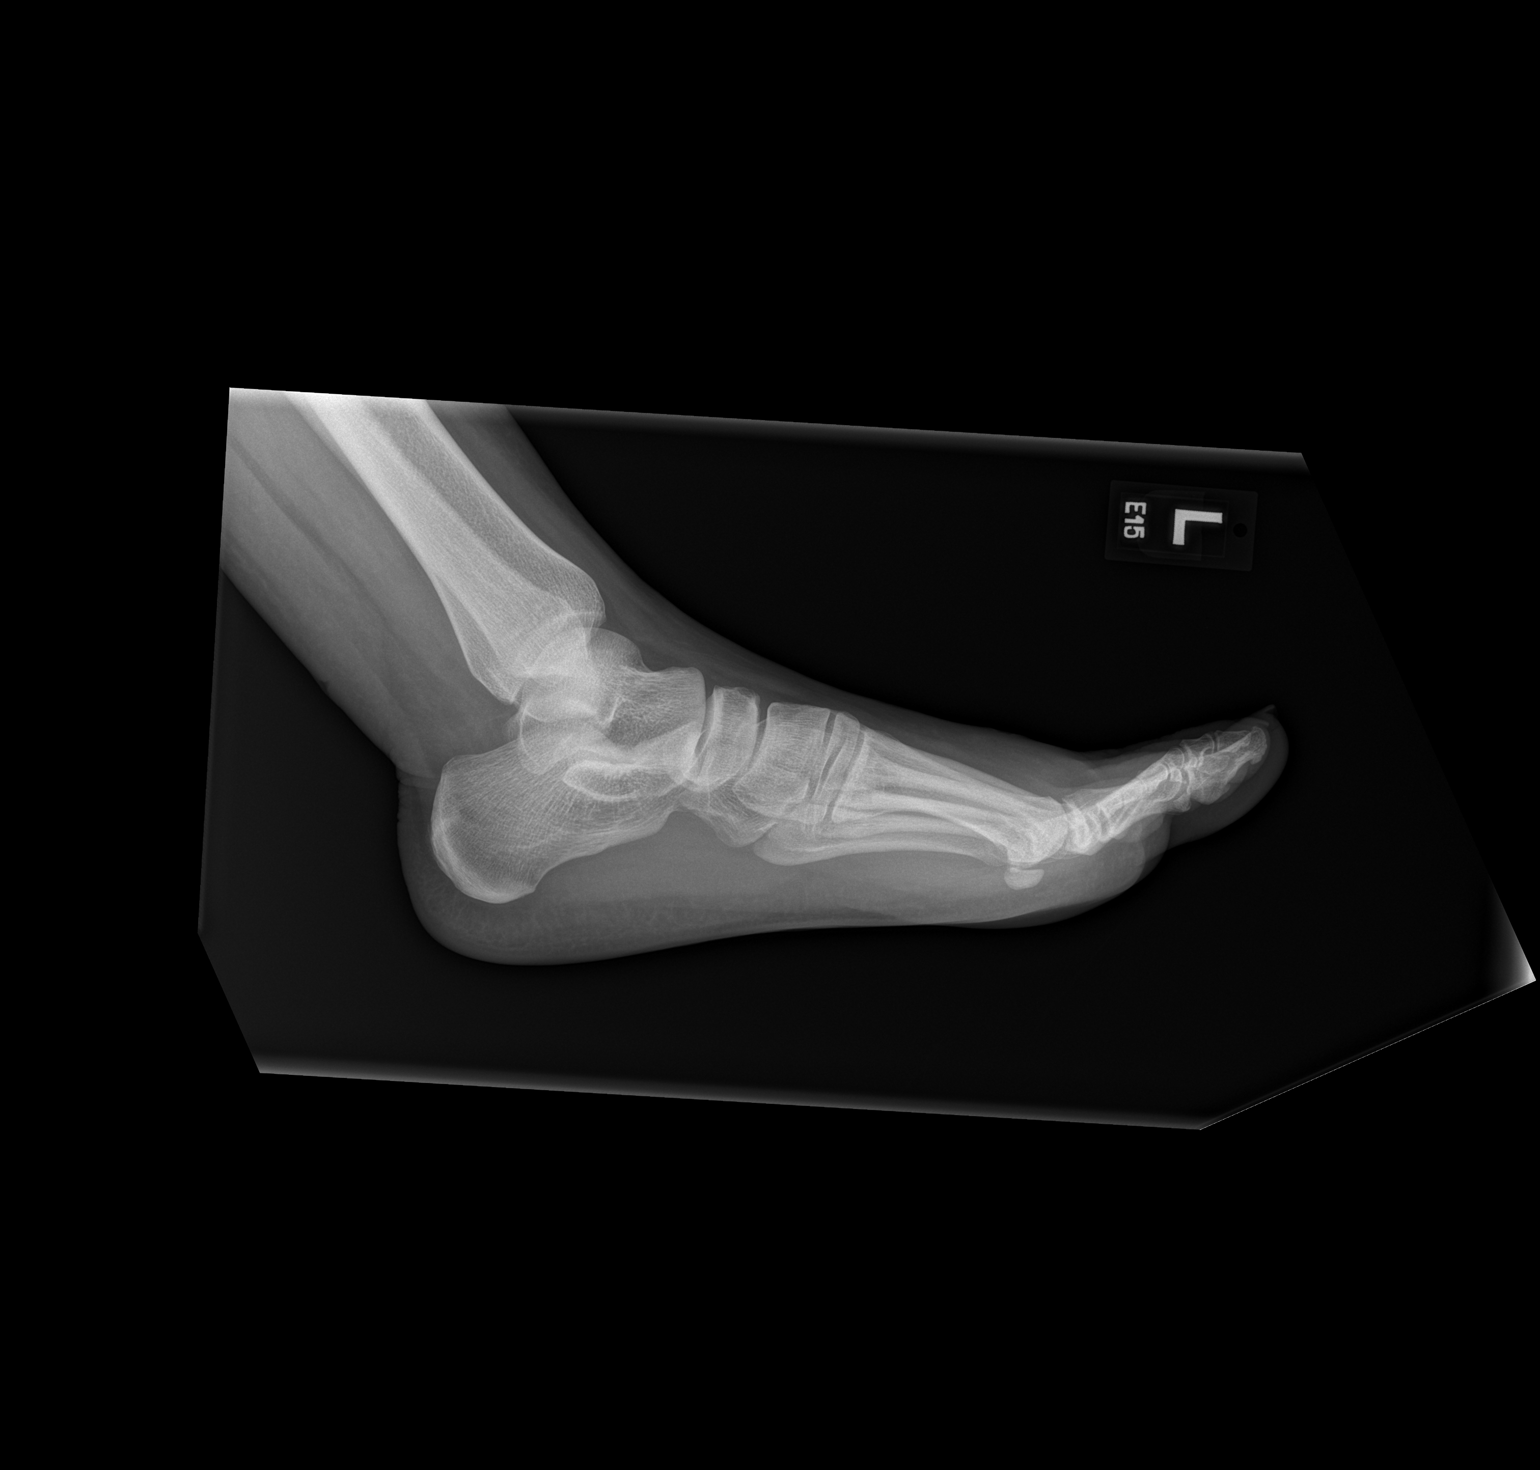

[x foot obl left]
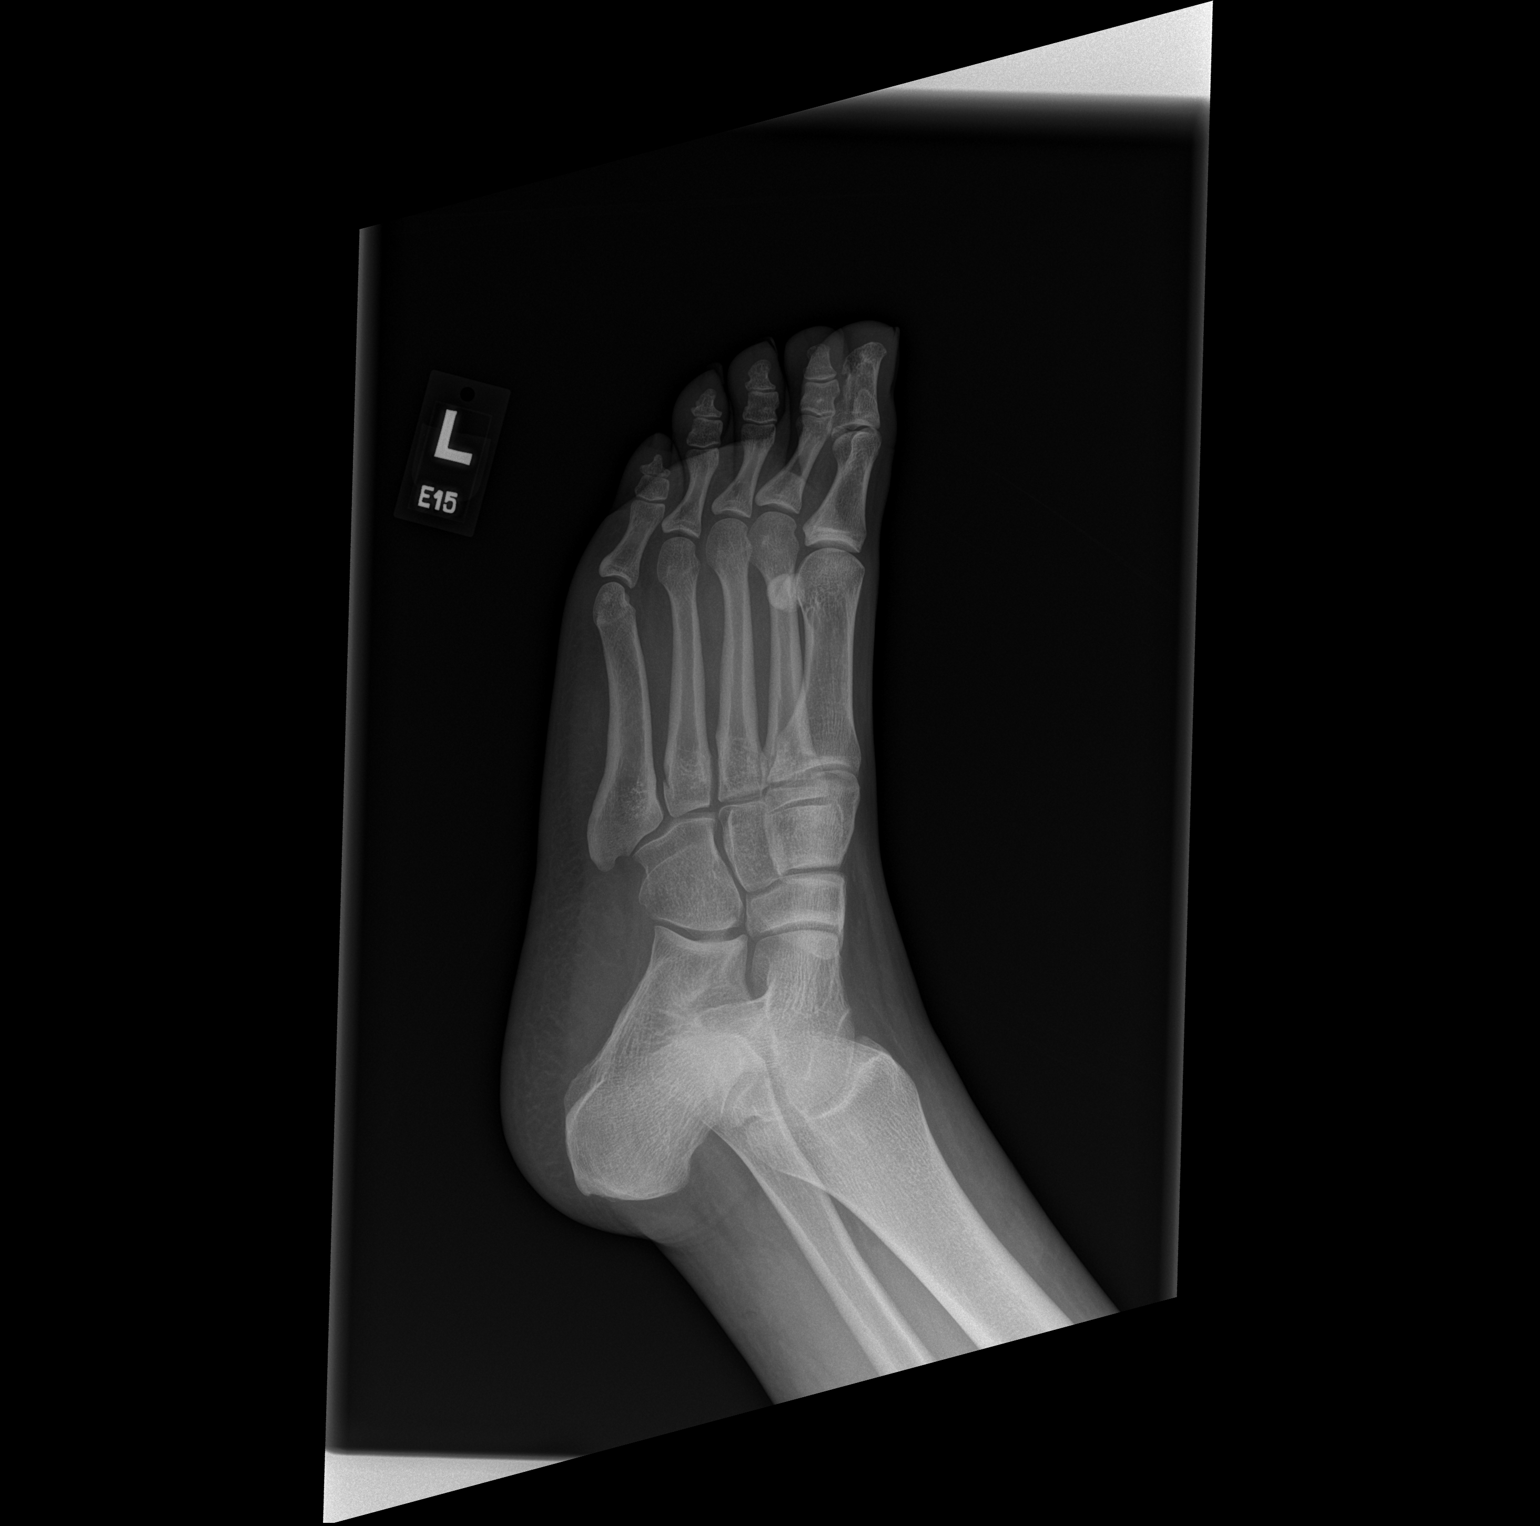

[3 of 3 positions shown; findings below may reference images not displayed]

FINDINGS: There is no evidence of fracture or dislocation. No erosion,
periosteal reaction, or bony destruction. There is soft tissue edema
over the dorsum of the forefoot. No soft tissue air or radiopaque
foreign body.
IMPRESSION: Soft tissue edema. No radiographic findings of osteomyelitis.

## 2023-05-05 IMAGING — CR DG ANKLE COMPLETE 3+V*L*
3 series · 3 of 3 positions shown · non-contrast
Comparison: None.

CLINICAL DATA: Wounds. Patient reports wound infection for 2 weeks
with drainage.

EXAM:
LEFT ANKLE COMPLETE - 3+ VIEW

[x ankle ap left]
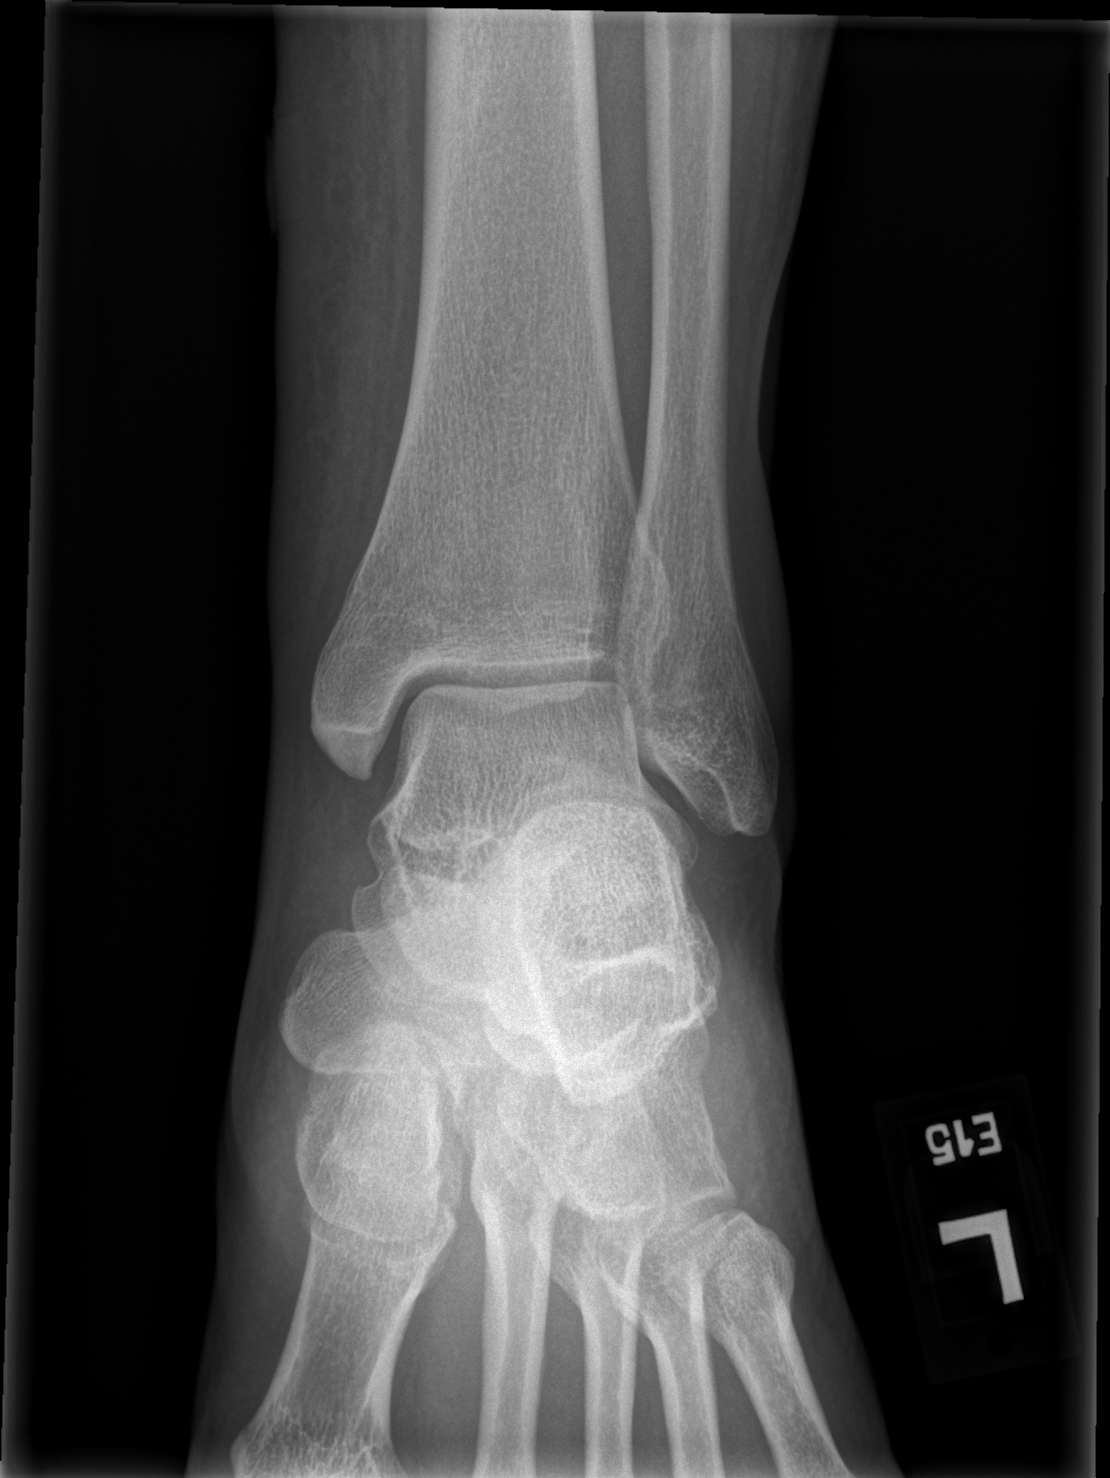

[x ankle obl left]
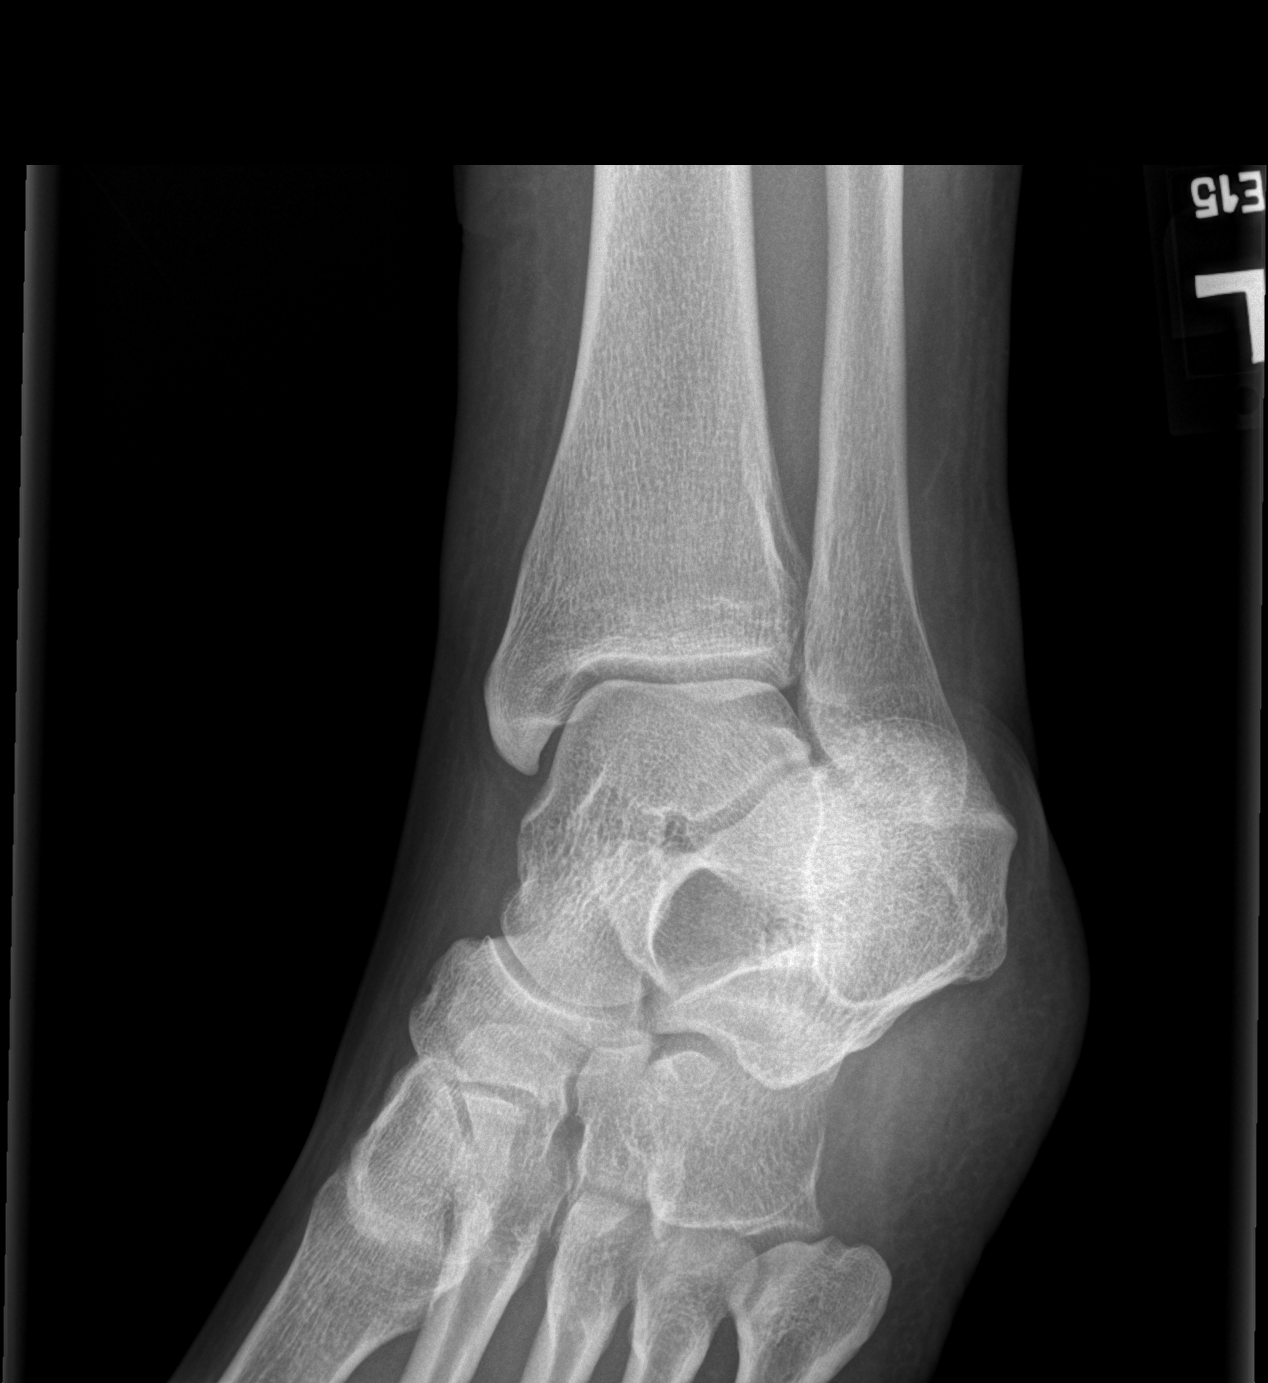

[x ankle lat left]
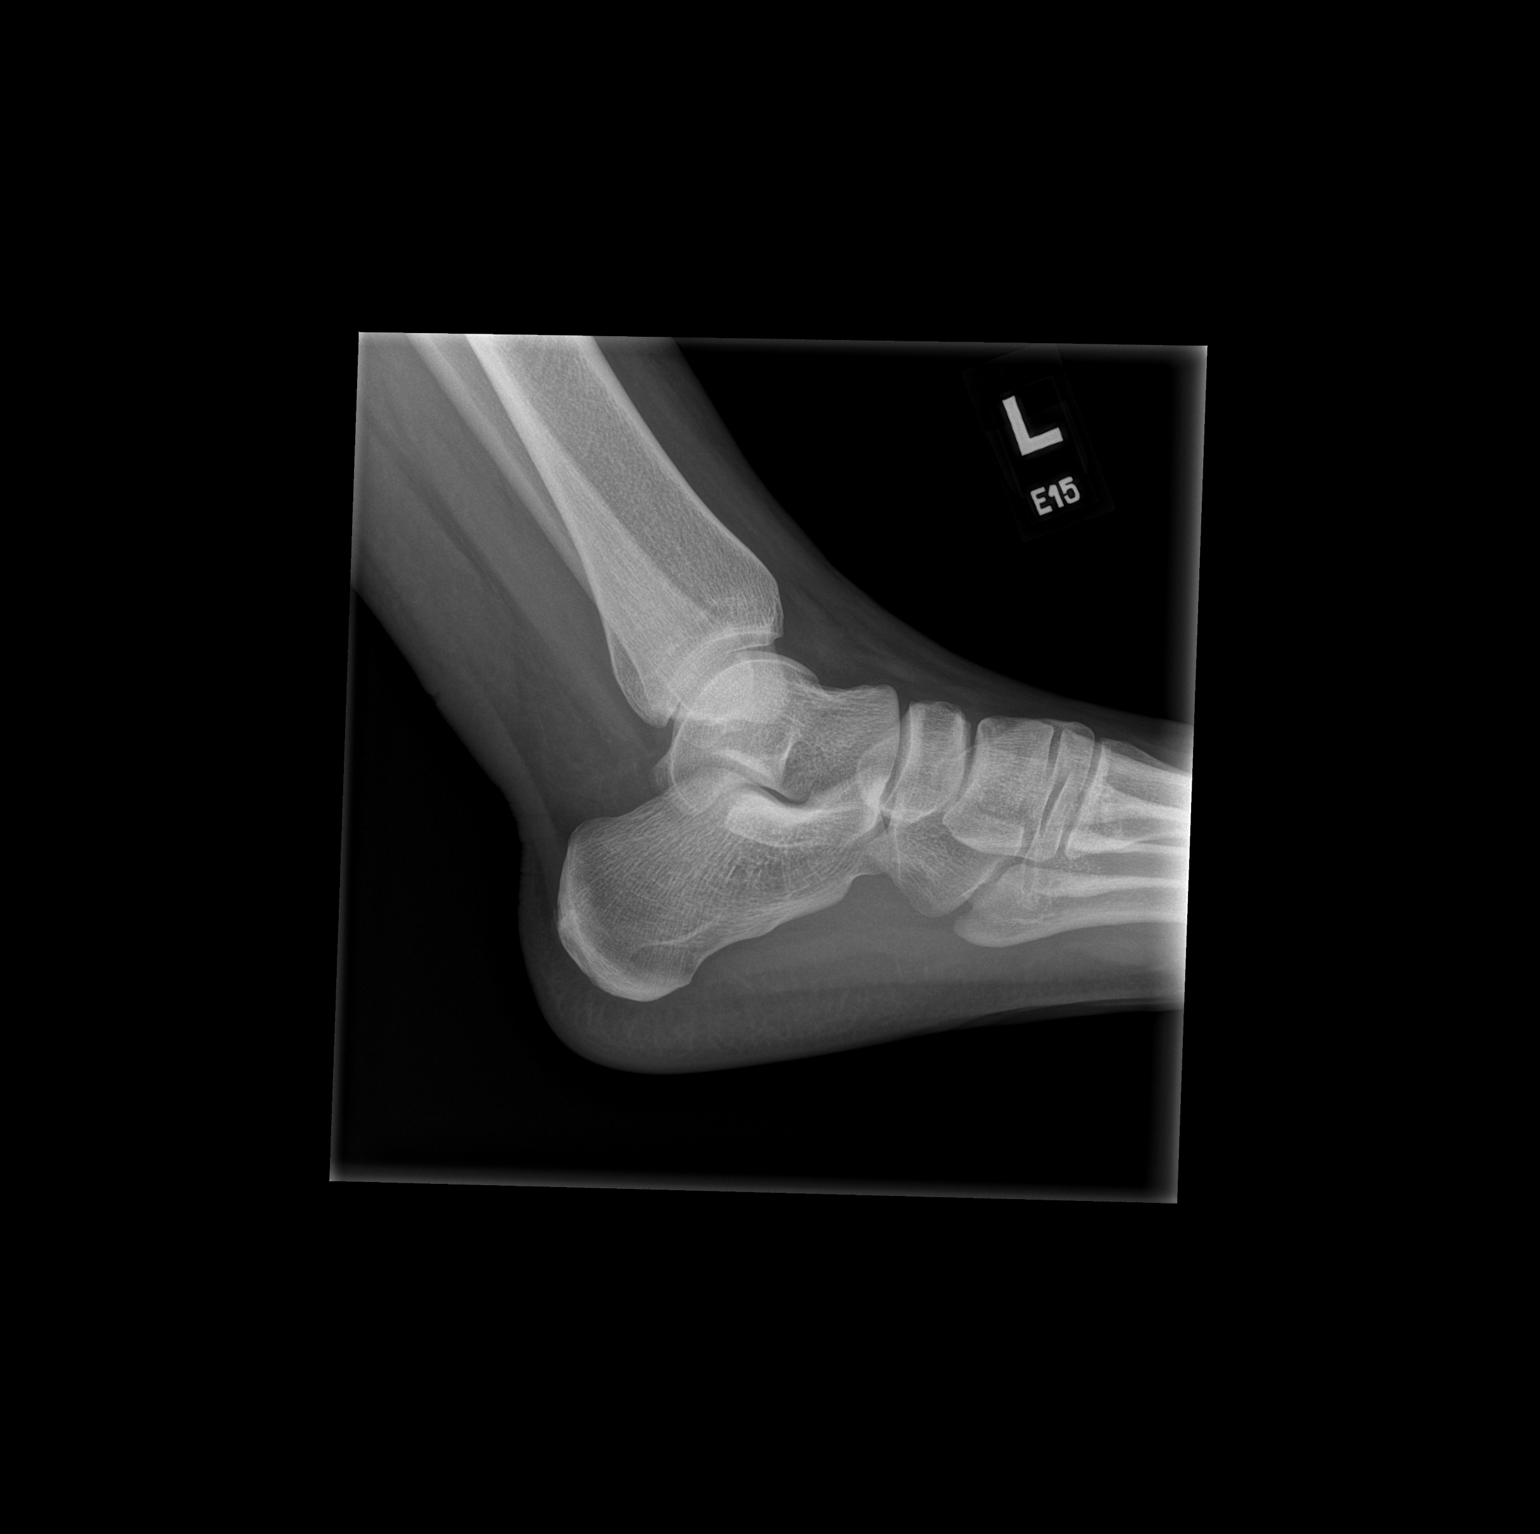

[3 of 3 positions shown; findings below may reference images not displayed]

FINDINGS: There is no evidence of fracture, dislocation, or joint effusion. No
erosion, bony destruction, or periosteal reaction. Skin thickening
with soft tissue defect posteriorly. No tracking soft tissue air. No
radiopaque foreign body.
IMPRESSION: Soft tissue defect posteriorly. No radiographic findings of
osteomyelitis.

## 2023-05-19 DIAGNOSIS — R10816 Epigastric abdominal tenderness: Secondary | ICD-10-CM | POA: Diagnosis not present

## 2023-05-19 DIAGNOSIS — H938X1 Other specified disorders of right ear: Secondary | ICD-10-CM | POA: Diagnosis not present

## 2023-05-22 DIAGNOSIS — K5010 Crohn's disease of large intestine without complications: Secondary | ICD-10-CM | POA: Diagnosis not present

## 2023-05-22 DIAGNOSIS — L88 Pyoderma gangrenosum: Secondary | ICD-10-CM | POA: Diagnosis not present

## 2023-05-22 DIAGNOSIS — K219 Gastro-esophageal reflux disease without esophagitis: Secondary | ICD-10-CM | POA: Diagnosis not present

## 2023-07-15 DIAGNOSIS — Z01419 Encounter for gynecological examination (general) (routine) without abnormal findings: Secondary | ICD-10-CM | POA: Diagnosis not present

## 2023-07-15 DIAGNOSIS — Z6841 Body Mass Index (BMI) 40.0 and over, adult: Secondary | ICD-10-CM | POA: Diagnosis not present

## 2023-07-15 DIAGNOSIS — N760 Acute vaginitis: Secondary | ICD-10-CM | POA: Diagnosis not present

## 2023-07-15 DIAGNOSIS — Z124 Encounter for screening for malignant neoplasm of cervix: Secondary | ICD-10-CM | POA: Diagnosis not present

## 2023-07-15 DIAGNOSIS — N926 Irregular menstruation, unspecified: Secondary | ICD-10-CM | POA: Diagnosis not present

## 2023-07-15 DIAGNOSIS — Z113 Encounter for screening for infections with a predominantly sexual mode of transmission: Secondary | ICD-10-CM | POA: Diagnosis not present

## 2023-09-09 DIAGNOSIS — J029 Acute pharyngitis, unspecified: Secondary | ICD-10-CM | POA: Diagnosis not present

## 2023-09-09 DIAGNOSIS — J0100 Acute maxillary sinusitis, unspecified: Secondary | ICD-10-CM | POA: Diagnosis not present

## 2023-09-09 DIAGNOSIS — R0981 Nasal congestion: Secondary | ICD-10-CM | POA: Diagnosis not present

## 2023-09-09 DIAGNOSIS — H6993 Unspecified Eustachian tube disorder, bilateral: Secondary | ICD-10-CM | POA: Diagnosis not present

## 2023-12-08 DIAGNOSIS — K5010 Crohn's disease of large intestine without complications: Secondary | ICD-10-CM | POA: Diagnosis not present

## 2023-12-08 DIAGNOSIS — Z6841 Body Mass Index (BMI) 40.0 and over, adult: Secondary | ICD-10-CM | POA: Diagnosis not present

## 2023-12-08 DIAGNOSIS — Z0000 Encounter for general adult medical examination without abnormal findings: Secondary | ICD-10-CM | POA: Diagnosis not present

## 2024-01-20 DIAGNOSIS — G4452 New daily persistent headache (NDPH): Secondary | ICD-10-CM | POA: Diagnosis not present

## 2024-01-20 DIAGNOSIS — K5010 Crohn's disease of large intestine without complications: Secondary | ICD-10-CM | POA: Diagnosis not present

## 2024-01-21 ENCOUNTER — Other Ambulatory Visit: Payer: Self-pay | Admitting: Family Medicine

## 2024-01-21 DIAGNOSIS — G4452 New daily persistent headache (NDPH): Secondary | ICD-10-CM

## 2024-01-23 ENCOUNTER — Ambulatory Visit
Admission: RE | Admit: 2024-01-23 | Discharge: 2024-01-23 | Disposition: A | Discharge status: Home / Self Care | Source: Ambulatory Visit | Attending: Family Medicine | Admitting: Family Medicine

## 2024-01-23 DIAGNOSIS — G4452 New daily persistent headache (NDPH): Secondary | ICD-10-CM

## 2024-02-25 DIAGNOSIS — K5010 Crohn's disease of large intestine without complications: Secondary | ICD-10-CM | POA: Diagnosis not present

## 2024-02-26 DIAGNOSIS — K5010 Crohn's disease of large intestine without complications: Secondary | ICD-10-CM | POA: Diagnosis not present

## 2024-03-08 DIAGNOSIS — K5010 Crohn's disease of large intestine without complications: Secondary | ICD-10-CM | POA: Diagnosis not present
